# Patient Record
Sex: Female | Born: 1945 | Race: White | Hispanic: No | Marital: Married | Smoking: Former smoker
Health system: Southern US, Community
[De-identification: ages and names within clinical notes are randomized; demographics above are authoritative.]

## PROBLEM LIST (undated history)

## (undated) DIAGNOSIS — M1990 Unspecified osteoarthritis, unspecified site: Secondary | ICD-10-CM

## (undated) DIAGNOSIS — N200 Calculus of kidney: Secondary | ICD-10-CM

## (undated) DIAGNOSIS — K122 Cellulitis and abscess of mouth: Secondary | ICD-10-CM

## (undated) DIAGNOSIS — I10 Essential (primary) hypertension: Secondary | ICD-10-CM

## (undated) DIAGNOSIS — E7800 Pure hypercholesterolemia, unspecified: Secondary | ICD-10-CM

## (undated) DIAGNOSIS — I619 Nontraumatic intracerebral hemorrhage, unspecified: Secondary | ICD-10-CM

## (undated) HISTORY — PX: APPENDECTOMY: SHX54

## (undated) HISTORY — PX: DILATION AND CURETTAGE OF UTERUS: SHX78

## (undated) HISTORY — PX: BACK SURGERY: SHX140

## (undated) HISTORY — PX: CHOLECYSTECTOMY: SHX55

---

## 1979-10-23 DIAGNOSIS — I619 Nontraumatic intracerebral hemorrhage, unspecified: Secondary | ICD-10-CM

## 1979-10-23 HISTORY — DX: Nontraumatic intracerebral hemorrhage, unspecified: I61.9

## 1988-10-22 HISTORY — PX: BACK SURGERY: SHX140

## 2006-11-29 ENCOUNTER — Ambulatory Visit: Payer: Self-pay | Admitting: Unknown Physician Specialty

## 2007-10-14 ENCOUNTER — Other Ambulatory Visit: Payer: Self-pay

## 2007-10-14 ENCOUNTER — Ambulatory Visit: Payer: Self-pay | Admitting: Unknown Physician Specialty

## 2007-10-20 ENCOUNTER — Ambulatory Visit: Payer: Self-pay | Admitting: Unknown Physician Specialty

## 2014-10-22 HISTORY — PX: ROOT CANAL: SHX2363

## 2014-10-22 HISTORY — PX: OTHER SURGICAL HISTORY: SHX169

## 2015-10-04 ENCOUNTER — Encounter
Admission: RE | Admit: 2015-10-04 | Discharge: 2015-10-04 | Disposition: A | Discharge status: Home / Self Care | Payer: BLUE CROSS/BLUE SHIELD | Source: Ambulatory Visit | Attending: Specialist | Admitting: Specialist

## 2015-10-04 DIAGNOSIS — Z01812 Encounter for preprocedural laboratory examination: Secondary | ICD-10-CM | POA: Diagnosis present

## 2015-10-04 HISTORY — DX: Cellulitis and abscess of mouth: K12.2

## 2015-10-04 HISTORY — DX: Nontraumatic intracerebral hemorrhage, unspecified: I61.9

## 2015-10-04 HISTORY — DX: Unspecified osteoarthritis, unspecified site: M19.90

## 2015-10-04 HISTORY — DX: Essential (primary) hypertension: I10

## 2015-10-04 HISTORY — DX: Calculus of kidney: N20.0

## 2015-10-04 HISTORY — DX: Pure hypercholesterolemia, unspecified: E78.00

## 2015-10-04 LAB — BASIC METABOLIC PANEL
ANION GAP: 8 (ref 5–15)
BUN: 20 mg/dL (ref 6–20)
CALCIUM: 9.6 mg/dL (ref 8.9–10.3)
CO2: 28 mmol/L (ref 22–32)
CREATININE: 0.71 mg/dL (ref 0.44–1.00)
Chloride: 102 mmol/L (ref 101–111)
GFR calc non Af Amer: >60 mL/min (ref >60)
Glucose, Bld: 99 mg/dL (ref 65–99)
Potassium: 3.3 mmol/L — ABNORMAL LOW (ref 3.5–5.1)
SODIUM: 138 mmol/L (ref 135–145)

## 2015-10-04 NOTE — Pre-Procedure Instructions (Signed)
Dr. Sabra Heck called regarding pt having to stop Mobic prior to surgery on 10/10/15 and pt having chronic back pain being severe, Tylenol will not help much with chronic back pain. Dr. Sabra Heck said pt can continue with Mobic.

## 2015-10-04 NOTE — Pre-Procedure Instructions (Signed)
Today's Met B results low potassium at 3.3, faxed to Dr. Sabra Heck informing him the potassium level will be rechcked the am of surgery.

## 2015-10-04 NOTE — Patient Instructions (Addendum)
  Your procedure is scheduled on: Monday Dec. 19, 2016. Report to Same Day Surgery. To find out your arrival time please call [phone] between 1PM - 3PM on Friday Dec.16, 2016 .  Remember: Instructions that are not followed completely may result in serious medical risk, up to and including death, or upon the discretion of your surgeon and anesthesiologist your surgery may need to be rescheduled.    _x___ 1. Do not eat food or drink liquids after midnight. No gum chewing or hard candies.     ____ 2. No Alcohol for 24 hours before or after surgery.   ____ 3. Bring all medications with you on the day of surgery if instructed.    __x__ 4. Notify your doctor if there is any change in your medical condition     (cold, fever, infections).     Do not wear jewelry, make-up, hairpins, clips or nail polish.  Do not wear lotions, powders, or perfumes. You may wear deodorant.  Do not shave 48 hours prior to surgery. Men may shave face and neck.  Do not bring valuables to the hospital.    [facility] is not responsible for any belongings or valuables.               Contacts, dentures or bridgework may not be worn into surgery.  Leave your suitcase in the car. After surgery it may be brought to your room.  For patients admitted to the hospital, discharge time is determined by your treatment team.   Patients discharged the day of surgery will not be allowed to drive home.    Please read over the following fact sheets that you were given:   [facility]  _x___ Take these medicines the morning of surgery with A SIP OF WATER:    1. atorvastatin (LIPITOR)  2. omeprazole (PRILOSEC)      ____ Fleet Enema (as directed)   _x__ Use CHG Soap as directed  ____ Use inhalers on the day of surgery  ____ Stop metformin 2 days prior to surgery    ____ Take 1/2 of usual insulin dose the night before surgery and none on the morning of surgery.   _x___ Stop aspirin on  now.  _x___ Stop Anti-inflammatories Aleve, Ibuprofen, Motrin now.   _x___ Stop supplements such as vitamins, ginkgo biloba until after surgery.    ____ Bring C-Pap to the hospital.

## 2015-10-10 ENCOUNTER — Ambulatory Visit: Payer: BLUE CROSS/BLUE SHIELD | Admitting: Certified Registered"

## 2015-10-10 ENCOUNTER — Encounter: Payer: Self-pay | Admitting: *Deleted

## 2015-10-10 ENCOUNTER — Observation Stay
Admission: RE | Admit: 2015-10-10 | Discharge: 2015-10-11 | Disposition: A | Discharge status: Home / Self Care | Payer: BLUE CROSS/BLUE SHIELD | Source: Ambulatory Visit | Attending: Specialist | Admitting: Specialist

## 2015-10-10 ENCOUNTER — Encounter
Admission: RE | Disposition: A | Discharge status: Home / Self Care | Payer: Self-pay | Source: Ambulatory Visit | Attending: Specialist

## 2015-10-10 DIAGNOSIS — I10 Essential (primary) hypertension: Secondary | ICD-10-CM | POA: Diagnosis not present

## 2015-10-10 DIAGNOSIS — M1812 Unilateral primary osteoarthritis of first carpometacarpal joint, left hand: Secondary | ICD-10-CM | POA: Diagnosis not present

## 2015-10-10 DIAGNOSIS — Z79899 Other long term (current) drug therapy: Secondary | ICD-10-CM | POA: Diagnosis not present

## 2015-10-10 DIAGNOSIS — M67442 Ganglion, left hand: Secondary | ICD-10-CM | POA: Insufficient documentation

## 2015-10-10 DIAGNOSIS — G5602 Carpal tunnel syndrome, left upper limb: Principal | ICD-10-CM | POA: Insufficient documentation

## 2015-10-10 DIAGNOSIS — M67432 Ganglion, left wrist: Secondary | ICD-10-CM | POA: Diagnosis not present

## 2015-10-10 DIAGNOSIS — M659 Synovitis and tenosynovitis, unspecified: Secondary | ICD-10-CM | POA: Insufficient documentation

## 2015-10-10 DIAGNOSIS — Z791 Long term (current) use of non-steroidal anti-inflammatories (NSAID): Secondary | ICD-10-CM | POA: Diagnosis not present

## 2015-10-10 DIAGNOSIS — G8918 Other acute postprocedural pain: Secondary | ICD-10-CM | POA: Diagnosis present

## 2015-10-10 DIAGNOSIS — Z87891 Personal history of nicotine dependence: Secondary | ICD-10-CM | POA: Insufficient documentation

## 2015-10-10 DIAGNOSIS — Z7982 Long term (current) use of aspirin: Secondary | ICD-10-CM | POA: Diagnosis not present

## 2015-10-10 HISTORY — PX: FINGER ARTHROPLASTY: SHX5017

## 2015-10-10 HISTORY — PX: GANGLION CYST EXCISION: SHX1691

## 2015-10-10 HISTORY — PX: CARPAL TUNNEL RELEASE: SHX101

## 2015-10-10 LAB — POCT I-STAT 4, (NA,K, GLUC, HGB,HCT)
GLUCOSE: 108 mg/dL — AB (ref 65–99)
HEMATOCRIT: 43 % (ref 36.0–46.0)
Hemoglobin: 14.6 g/dL (ref 12.0–15.0)
POTASSIUM: 3.7 mmol/L (ref 3.5–5.1)
SODIUM: 139 mmol/L (ref 135–145)

## 2015-10-10 SURGERY — CARPAL TUNNEL RELEASE
Anesthesia: General | Laterality: Left

## 2015-10-10 MED ORDER — POTASSIUM CITRATE ER 10 MEQ (1080 MG) PO TBCR: 10.0000 meq | EXTENDED_RELEASE_TABLET | Freq: Two times a day (BID) | ORAL | Status: DC

## 2015-10-10 MED ORDER — CEFAZOLIN SODIUM 1-5 GM-% IV SOLN: 1.0000 g | Freq: Three times a day (TID) | INTRAVENOUS | Status: DC

## 2015-10-10 MED ORDER — FENTANYL CITRATE (PF) 100 MCG/2ML IJ SOLN
INTRAMUSCULAR | Status: DC | PRN
  Administered 2015-10-10 (×4): 50 ug via INTRAVENOUS

## 2015-10-10 MED ORDER — FENTANYL CITRATE (PF) 100 MCG/2ML IJ SOLN
INTRAMUSCULAR | Status: AC
  Filled 2015-10-10: qty 2

## 2015-10-10 MED ORDER — SENNOSIDES-DOCUSATE SODIUM 8.6-50 MG PO TABS
5.0000 | ORAL_TABLET | Freq: Every day | ORAL | Status: DC
  Administered 2015-10-10: 5 via ORAL
  Filled 2015-10-10: qty 5

## 2015-10-10 MED ORDER — LISINOPRIL 20 MG PO TABS
20.0000 mg | ORAL_TABLET | Freq: Every day | ORAL | Status: DC
  Administered 2015-10-10 – 2015-10-11 (×2): 20 mg via ORAL
  Filled 2015-10-10 (×2): qty 1

## 2015-10-10 MED ORDER — GABAPENTIN 400 MG PO CAPS: 400.0000 mg | ORAL_CAPSULE | Freq: Three times a day (TID) | ORAL | Status: AC

## 2015-10-10 MED ORDER — ONDANSETRON HCL 4 MG/2ML IJ SOLN
4.0000 mg | Freq: Once | INTRAMUSCULAR | Status: AC | PRN
  Administered 2015-10-10: 4 mg via INTRAVENOUS

## 2015-10-10 MED ORDER — MELOXICAM 15 MG PO TABS
15.0000 mg | ORAL_TABLET | Freq: Every day | ORAL | Status: AC
Start: 2015-10-10 — End: ?

## 2015-10-10 MED ORDER — HYDROMORPHONE HCL 1 MG/ML IJ SOLN
INTRAMUSCULAR | Status: AC
  Filled 2015-10-10: qty 1

## 2015-10-10 MED ORDER — HYDROCHLOROTHIAZIDE 25 MG PO TABS
25.0000 mg | ORAL_TABLET | Freq: Every day | ORAL | Status: DC
Start: 2015-10-10 — End: 2015-10-11
  Administered 2015-10-10 – 2015-10-11 (×2): 25 mg via ORAL
  Filled 2015-10-10 (×2): qty 1

## 2015-10-10 MED ORDER — MELOXICAM 7.5 MG PO TABS
15.0000 mg | ORAL_TABLET | Freq: Once | ORAL | Status: AC
  Administered 2015-10-10: 7.5 mg via ORAL

## 2015-10-10 MED ORDER — BISACODYL 5 MG PO TBEC: 5.0000 mg | DELAYED_RELEASE_TABLET | Freq: Every day | ORAL | Status: DC | PRN

## 2015-10-10 MED ORDER — GLYCOPYRROLATE 0.2 MG/ML IJ SOLN
INTRAMUSCULAR | Status: DC | PRN
  Administered 2015-10-10: 0.2 mg via INTRAVENOUS

## 2015-10-10 MED ORDER — MAGNESIUM CITRATE PO SOLN: 1.0000 | Freq: Once | ORAL | Status: DC | PRN

## 2015-10-10 MED ORDER — DIPHENHYDRAMINE HCL 25 MG PO CAPS: 25.0000 mg | ORAL_CAPSULE | Freq: Four times a day (QID) | ORAL | Status: DC | PRN

## 2015-10-10 MED ORDER — LISINOPRIL-HYDROCHLOROTHIAZIDE 20-25 MG PO TABS: 1.0000 | ORAL_TABLET | ORAL | Status: DC

## 2015-10-10 MED ORDER — MELOXICAM 7.5 MG PO TABS
ORAL_TABLET | ORAL | Status: AC
  Administered 2015-10-10: 7.5 mg via ORAL
  Filled 2015-10-10: qty 2

## 2015-10-10 MED ORDER — GABAPENTIN 400 MG PO CAPS
ORAL_CAPSULE | ORAL | Status: AC
  Administered 2015-10-10: 400 mg via ORAL
  Filled 2015-10-10: qty 1

## 2015-10-10 MED ORDER — SODIUM CHLORIDE 0.9 % IV SOLN
INTRAVENOUS | Status: DC
  Administered 2015-10-10: 20:00:00 via INTRAVENOUS

## 2015-10-10 MED ORDER — HYDROCODONE-ACETAMINOPHEN 7.5-325 MG PO TABS
1.0000 | ORAL_TABLET | ORAL | Status: DC | PRN
  Administered 2015-10-11: 1 via ORAL
  Filled 2015-10-10: qty 1

## 2015-10-10 MED ORDER — CEFAZOLIN SODIUM 1-5 GM-% IV SOLN
1.0000 g | Freq: Three times a day (TID) | INTRAVENOUS | Status: DC
  Administered 2015-10-10 – 2015-10-11 (×2): 1 g via INTRAVENOUS
  Filled 2015-10-10 (×4): qty 50

## 2015-10-10 MED ORDER — PHENYLEPHRINE HCL 10 MG/ML IJ SOLN
INTRAMUSCULAR | Status: DC | PRN
  Administered 2015-10-10 (×3): 100 ug via INTRAVENOUS

## 2015-10-10 MED ORDER — DEXAMETHASONE SODIUM PHOSPHATE 10 MG/ML IJ SOLN
INTRAMUSCULAR | Status: DC | PRN
  Administered 2015-10-10: 5 mg via INTRAVENOUS

## 2015-10-10 MED ORDER — POLYETHYLENE GLYCOL 3350 17 G PO PACK
17.0000 g | PACK | Freq: Every day | ORAL | Status: DC
  Administered 2015-10-10: 17 g via ORAL
  Filled 2015-10-10: qty 1

## 2015-10-10 MED ORDER — LACTATED RINGERS IV SOLN
INTRAVENOUS | Status: DC
  Administered 2015-10-10 (×2): via INTRAVENOUS

## 2015-10-10 MED ORDER — NEOMYCIN-POLYMYXIN B GU 40-200000 IR SOLN
Status: AC
  Filled 2015-10-10: qty 2

## 2015-10-10 MED ORDER — NALOXONE HCL 0.4 MG/ML IJ SOLN: 0.4000 mg | INTRAMUSCULAR | Status: DC | PRN

## 2015-10-10 MED ORDER — HYDROCODONE-ACETAMINOPHEN 5-325 MG PO TABS: 1.0000 | ORAL_TABLET | Freq: Four times a day (QID) | ORAL | Status: AC | PRN

## 2015-10-10 MED ORDER — HYDROMORPHONE HCL 1 MG/ML IJ SOLN
0.5000 mg | INTRAMUSCULAR | Status: DC | PRN
  Administered 2015-10-10: 0.5 mg via INTRAVENOUS

## 2015-10-10 MED ORDER — MAGNESIUM HYDROXIDE 400 MG/5ML PO SUSP: 30.0000 mL | Freq: Every day | ORAL | Status: DC | PRN

## 2015-10-10 MED ORDER — ONDANSETRON HCL 4 MG/2ML IJ SOLN
INTRAMUSCULAR | Status: AC
  Filled 2015-10-10: qty 2

## 2015-10-10 MED ORDER — CEFAZOLIN SODIUM-DEXTROSE 2-3 GM-% IV SOLR
INTRAVENOUS | Status: AC
  Filled 2015-10-10: qty 50

## 2015-10-10 MED ORDER — MIDAZOLAM HCL 2 MG/2ML IJ SOLN
INTRAMUSCULAR | Status: DC | PRN
  Administered 2015-10-10: 2 mg via INTRAVENOUS

## 2015-10-10 MED ORDER — PANTOPRAZOLE SODIUM 40 MG PO TBEC
40.0000 mg | DELAYED_RELEASE_TABLET | Freq: Every day | ORAL | Status: DC
  Administered 2015-10-10: 40 mg via ORAL
  Filled 2015-10-10 (×2): qty 1

## 2015-10-10 MED ORDER — PROPOFOL 10 MG/ML IV BOLUS
INTRAVENOUS | Status: DC | PRN
  Administered 2015-10-10: 150 mg via INTRAVENOUS

## 2015-10-10 MED ORDER — GABAPENTIN 300 MG PO CAPS: 300.0000 mg | ORAL_CAPSULE | Freq: Two times a day (BID) | ORAL | Status: DC

## 2015-10-10 MED ORDER — ASPIRIN EC 81 MG PO TBEC
81.0000 mg | DELAYED_RELEASE_TABLET | ORAL | Status: DC
  Administered 2015-10-11: 81 mg via ORAL
  Filled 2015-10-10: qty 1

## 2015-10-10 MED ORDER — CEFAZOLIN SODIUM-DEXTROSE 2-3 GM-% IV SOLR
2.0000 g | Freq: Once | INTRAVENOUS | Status: AC
  Administered 2015-10-10: 2 g via INTRAVENOUS

## 2015-10-10 MED ORDER — GINKGO BILOBA 40 MG PO TABS: 1.0000 | ORAL_TABLET | ORAL | Status: DC

## 2015-10-10 MED ORDER — VITAMIN C 500 MG PO TABS
500.0000 mg | ORAL_TABLET | ORAL | Status: DC
  Administered 2015-10-11: 500 mg via ORAL
  Filled 2015-10-10: qty 1

## 2015-10-10 MED ORDER — ATORVASTATIN CALCIUM 20 MG PO TABS
20.0000 mg | ORAL_TABLET | ORAL | Status: DC
  Administered 2015-10-11: 20 mg via ORAL
  Filled 2015-10-10: qty 1

## 2015-10-10 MED ORDER — BUPIVACAINE HCL (PF) 0.5 % IJ SOLN
INTRAMUSCULAR | Status: AC
  Filled 2015-10-10: qty 60

## 2015-10-10 MED ORDER — ADULT MULTIVITAMIN W/MINERALS CH
1.0000 | ORAL_TABLET | ORAL | Status: DC
  Filled 2015-10-10: qty 1

## 2015-10-10 MED ORDER — GABAPENTIN 400 MG PO CAPS
400.0000 mg | ORAL_CAPSULE | Freq: Once | ORAL | Status: AC
  Administered 2015-10-10: 400 mg via ORAL

## 2015-10-10 MED ORDER — LIDOCAINE HCL (CARDIAC) 20 MG/ML IV SOLN
INTRAVENOUS | Status: DC | PRN
  Administered 2015-10-10: 50 mg via INTRAVENOUS

## 2015-10-10 MED ORDER — FENTANYL CITRATE (PF) 100 MCG/2ML IJ SOLN
25.0000 ug | INTRAMUSCULAR | Status: AC | PRN
  Administered 2015-10-10 (×6): 25 ug via INTRAVENOUS

## 2015-10-10 MED ORDER — VITAMIN D 1000 UNITS PO TABS
1000.0000 [IU] | ORAL_TABLET | ORAL | Status: DC
  Administered 2015-10-11: 1000 [IU] via ORAL
  Filled 2015-10-10: qty 1

## 2015-10-10 MED ORDER — MELOXICAM 7.5 MG PO TABS
7.5000 mg | ORAL_TABLET | Freq: Two times a day (BID) | ORAL | Status: DC
  Administered 2015-10-10 – 2015-10-11 (×2): 7.5 mg via ORAL
  Filled 2015-10-10 (×2): qty 1

## 2015-10-10 MED ORDER — NALOXONE HCL 2 MG/2ML IJ SOSY: 0.4000 mg | PREFILLED_SYRINGE | INTRAMUSCULAR | Status: DC | PRN

## 2015-10-10 SURGICAL SUPPLY — 49 items
BLADE OSC/SAGITTAL MD 5.5X18 (BLADE) IMPLANT
BLADE SURG MINI STRL (BLADE) ×3 IMPLANT
BNDG ESMARK 4X12 TAN STRL LF (GAUZE/BANDAGES/DRESSINGS) IMPLANT
CANISTER SUCT 1200ML W/VALVE (MISCELLANEOUS) ×3 IMPLANT
CHLORAPREP W/TINT 26ML (MISCELLANEOUS) ×3 IMPLANT
DECANTER SPIKE VIAL GLASS SM (MISCELLANEOUS) ×3 IMPLANT
DRAPE FLUOR MINI C-ARM 54X84 (DRAPES) ×3 IMPLANT
GAUZE FLUFF 18X24 1PLY STRL (GAUZE/BANDAGES/DRESSINGS) ×9 IMPLANT
GAUZE PETRO XEROFOAM 1X8 (MISCELLANEOUS) ×6 IMPLANT
GAUZE SPONGE 4X4 12PLY STRL (GAUZE/BANDAGES/DRESSINGS) ×3 IMPLANT
GLOVE BIO SURGEON STRL SZ7.5 (GLOVE) ×3 IMPLANT
GLOVE BIO SURGEON STRL SZ8 (GLOVE) ×3 IMPLANT
GLOVE SURG ORTHO 8.0 STRL STRW (GLOVE) ×3 IMPLANT
GOWN STRL REUS W/ TWL LRG LVL3 (GOWN DISPOSABLE) ×2 IMPLANT
GOWN STRL REUS W/TWL LRG LVL3 (GOWN DISPOSABLE) ×4
KIT RM TURNOVER STRD PROC AR (KITS) ×3 IMPLANT
LOOP VESSEL MINI 0.8X406 BLUE (MISCELLANEOUS) ×1 IMPLANT
LOOPS BLUE MINI 0.8X406MM (MISCELLANEOUS) ×2
NEEDLE FILTER BLUNT 18X 1/2SAF (NEEDLE) ×2
NEEDLE FILTER BLUNT 18X1 1/2 (NEEDLE) ×1 IMPLANT
NS IRRIG 500ML POUR BTL (IV SOLUTION) ×3 IMPLANT
PACK EXTREMITY ARMC (MISCELLANEOUS) ×3 IMPLANT
PAD CAST CTTN 4X4 STRL (SOFTGOODS) ×1 IMPLANT
PAD GROUND ADULT SPLIT (MISCELLANEOUS) ×3 IMPLANT
PAD PREP 24X41 OB/GYN DISP (PERSONAL CARE ITEMS) ×3 IMPLANT
PADDING CAST COTTON 4X4 STRL (SOFTGOODS) ×2
PASSER SUT SWANSON 36MM LOOP (INSTRUMENTS) ×3 IMPLANT
SPLINT CAST 1 STEP 3X12 (MISCELLANEOUS) ×3 IMPLANT
SPONGE LAP 18X18 5 PK (GAUZE/BANDAGES/DRESSINGS) ×3 IMPLANT
STOCKINETTE BIAS CUT 4 980044 (GAUZE/BANDAGES/DRESSINGS) IMPLANT
STOCKINETTE STRL 4IN 9604848 (GAUZE/BANDAGES/DRESSINGS) IMPLANT
STRAP SAFETY BODY (MISCELLANEOUS) ×3 IMPLANT
SUT ETHILON 4-0 (SUTURE) ×4
SUT ETHILON 4-0 FS2 18XMFL BLK (SUTURE) ×2
SUT ETHILON 5-0 (SUTURE) ×2
SUT ETHILON 5-0 C-3 18XMFL BLK (SUTURE) ×1
SUT ETHILON 5-0 FS-2 18 BLK (SUTURE) ×3 IMPLANT
SUT VIC AB 2-0 SH 27 (SUTURE) ×2
SUT VIC AB 2-0 SH 27XBRD (SUTURE) ×1 IMPLANT
SUT VIC AB 3-0 SH 27 (SUTURE) ×2
SUT VIC AB 3-0 SH 27X BRD (SUTURE) ×1 IMPLANT
SUT VIC AB 4-0 FS2 27 (SUTURE) ×3 IMPLANT
SUT VIC AB 4-0 SH 27 (SUTURE) ×2
SUT VIC AB 4-0 SH 27XANBCTRL (SUTURE) ×1 IMPLANT
SUTURE ETHLN 4-0 FS2 18XMF BLK (SUTURE) ×2 IMPLANT
SUTURE ETHLN 5-0 C3 18XMF BLK (SUTURE) ×1 IMPLANT
SYR 30ML LL (SYRINGE) ×3 IMPLANT
SYR 5ML LL (SYRINGE) ×3 IMPLANT
WIRE Z .062 C-WIRE SPADE TIP (WIRE) IMPLANT

## 2015-10-10 NOTE — Progress Notes (Signed)
  Subjective:  Called by Same Day Surgery Unit RN.  Patient has had severe pain post-op requiring doses of fentanyl and dilaudid.  Now patient's pain better controlled but patient drowsy.  Anesthesia, Dr. Laureen Abrahams has recommended to RN that patient be admitted for monitoring.  RN reports that patient's fingers on the operative left hand are well perfused and she is neurovascularly intact.  Patient had left carpal tunnel release, CMC arthroplasty, and ganglion removal.  Objective:   VITALS:   Filed Vitals:   10/10/15 1639 10/10/15 1736 10/10/15 1829 10/10/15 1921  BP: 125/67 123/70 129/73   Pulse: 92 87 82 74  Temp: 97.7 F (36.5 C) 97.8 F (36.6 C) 97.4 F (36.3 C) 97.2 F (36.2 C)  TempSrc: Oral Tympanic Tympanic Tympanic  Resp: 14 14 14 14   Height:      Weight:      SpO2: 92% 92% 99% 96%    LABS  Results for orders placed or performed during the hospital encounter of 10/10/15 (from the past 24 hour(s))  I-STAT 4, (NA,K, GLUC, HGB,HCT)     Status: Abnormal   Collection Time: 10/10/15 12:47 PM  Result Value Ref Range   Sodium 139 135 - 145 mmol/L   Potassium 3.7 3.5 - 5.1 mmol/L   Glucose, Bld 108 (H) 65 - 99 mg/dL   HCT 43.0 36.0 - 46.0 %   Hemoglobin 14.6 12.0 - 15.0 g/dL    No results found.  Assessment/Plan: Day of Surgery   Active Problems:   Post-op pain  Patient being admitted to orthopaedics for post-op monitoring.  Patient will require close O2 and neurovascular monitoring.  Continue strict elevation of left upper extremity.  Continue post-op IV kefzol overnight.  Pain management with norco and dilaudid for severe pain.   Thornton Park , MD 10/10/2015, 7:28 PM

## 2015-10-10 NOTE — Progress Notes (Signed)
PHARMACIST - PHYSICIAN ORDER COMMUNICATION  CONCERNING: P&T Medication Policy on Herbal Medications  DESCRIPTION:  This patient's order for:  Ginko biloba  has been noted.  This product(s) is classified as an "herbal" or natural product. Due to a lack of definitive safety studies or FDA approval, nonstandard manufacturing practices, plus the potential risk of unknown drug-drug interactions while on inpatient medications, the Pharmacy and Therapeutics Committee does not permit the use of "herbal" or natural products of this type within [facility].   ACTION TAKEN: The pharmacy department is unable to verify this order at this time and your patient has been informed of this safety policy. Please reevaluate patient's clinical condition at discharge and address if the herbal or natural product(s) should be resumed at that time.

## 2015-10-10 NOTE — OR Nursing (Signed)
Patient to rm 142-- report to [facility]--

## 2015-10-10 NOTE — Discharge Instructions (Signed)
AMBULATORY SURGERY  DISCHARGE INSTRUCTIONS   1) The drugs that you were given will stay in your system until tomorrow so for the next 24 hours you should not:  A) Drive an automobile B) Make any legal decisions C) Drink any alcoholic beverage   2) You may resume regular meals tomorrow.  Today it is better to start with liquids and gradually work up to solid foods.  You may eat anything you prefer, but it is better to start with liquids, then soup and crackers, and gradually work up to solid foods.   3) Please notify your doctor immediately if you have any unusual bleeding, trouble breathing, redness and pain at the surgery site, drainage, fever, or pain not relieved by medication.    4) Additional Instructions:        Please contact your physician with any problems or Same Day Surgery at [phone], Monday through Friday 6 am to 4 pm, or [place] at [facility] number at [phone].                                                                   Keep dressing clean and dry--Do not remove dressing--Keep arm elevated above heart

## 2015-10-10 NOTE — OR Nursing (Signed)
Patient pulse ox continues to drop into low 80,s when patient sleeping--pt nauseated and given Zofran--continues to be nauseated--Anesthesia inst to call surgeon if she didn't improve in an hour--After 1 hour called Dr Rolm Gala as he was on call--He is going to adm patient for obs.

## 2015-10-10 NOTE — Anesthesia Procedure Notes (Signed)
Procedure Name: LMA Insertion Date/Time: 10/10/2015 1:19 PM Performed by: Rolla Plate Pre-anesthesia Checklist: Patient identified, Patient being monitored, Timeout performed, Emergency Drugs available and Suction available Patient Re-evaluated:Patient Re-evaluated prior to inductionOxygen Delivery Method: Circle system utilized Preoxygenation: Pre-oxygenation with 100% oxygen Intubation Type: IV induction Ventilation: Mask ventilation without difficulty LMA: LMA inserted LMA Size: 3.0 Tube type: Oral Number of attempts: 1 Placement Confirmation: positive ETCO2 and breath sounds checked- equal and bilateral Tube secured with: Tape Dental Injury: Teeth and Oropharynx as per pre-operative assessment

## 2015-10-10 NOTE — Transfer of Care (Signed)
Immediate Anesthesia Transfer of Care Note  Patient: Savannah Abbott  Procedure(s) Performed: Procedure(s): CARPAL TUNNEL RELEASE (Left)  ARTHROPLASTY/LEFT Thumb CMC (Left) REMOVAL GANGLION from wrist and between ring and little fingeer (Left)  Patient Location: PACU  Anesthesia Type:General  Level of Consciousness: sedated  Airway & Oxygen Therapy: Patient Spontanous Breathing and Patient connected to face mask oxygen  Post-op Assessment: Report given to RN and Post -op Vital signs reviewed and stable  Post vital signs: Reviewed and stable  Last Vitals:  Filed Vitals:   10/10/15 1215 10/10/15 1528  BP: 134/76 116/88  Pulse: 77 82  Temp: 36.9 C 36.2 C  Resp: 14 16    Complications: No apparent anesthesia complications

## 2015-10-10 NOTE — Op Note (Signed)
10/10/2015  3:25 PM  PATIENT:  Savannah Abbott    PRE-OPERATIVE DIAGNOSIS:  LEFT CARPAL TUNNEL SYNDROME,OSTEOARTHRITIS THUMB Wanda LEFT WRIST AND HAND  POST-OPERATIVE DIAGNOSIS:  Same  PROCEDURE:  LEFT CARPAL TUNNEL RELEASE,  ARTHROPLASTY/LEFT Thumb CMC WITH PALMARIS TENDON, REMOVAL GANGLION from wrist and  d little finger flexor tendon sheath  SURGEON:  Park Breed, MD  ANESTHESIA:   General  PREOPERATIVE INDICATIONS:  Savannah Abbott is a  69 y.o. female with a diagnosis of LEFT CARPAL TUNNEL SYNDROME,OSTEOARTHRITIS,GANGLION LEFT WRIST AND HAND who failed conservative measures and elected for surgical management.    The risks benefits and alternatives were discussed with the patient preoperatively including but not limited to the risks of infection, bleeding, nerve injury, cardiopulmonary complications, the need for revision surgery, among others, and the patient was willing to proceed.  EBL: None  TOURNIQUET TIME: 92 minutes  OPERATIVE IMPLANTS: None  OPERATIVE FINDINGS: Advanced arthritis left  thumb cmc joint.  Marked compression left median nerve.  Ganglion of tendon sheath left small finger.  Synovitis, left wrist dorsally with small ganglion  OPERATIVE PROCEDURE: The patient was brought to the operating room and underwent satisfactory IV regional anesthesia in the supine position. The operative arm was prepped and draped in a sterile fashion. The patient was noted to have an excellent palmaris longus tendon. 3 short transverse incisions were made over the course of the tendon and it was harvested under direct vision. It was then placed in a moist sponge on the back table. An S-shaped incision was then made over the base of the thumb metacarpal dorsally. Dissection was carried out carefully under loupe magnification, carefully sparing the sensory nerves. The tendon sheath around the abductor pollicis longus and extensor pollicis brevis was released under  direct vision, including over the radial styloid. Nerves were carefully spared. Retractors were inserted and the capsule of the trapezium and metacarpal were opened longitudinally. The radial artery and veins were carefully dissected free and retracted with a vessel loop drain. The capsule was dissected off the trapezium and the trapezium was then morselized with the oscillating saw and rongeur. The flexor carpi radialis tendon was freed up in the base of the wound. The palmaris longus tendon was passed beneath this. A 3.5 mm drill was used to create a tunnel through the base of the metacarpal. The tendon was then passed up through the metacarpal using a tendon passer. The thumb had been placed in traction which was then released. The tendon was tied upon itself and the knot was sutured to itself prevent slippage. It was then tied in multiple knots and sutured into a ball. This was then rotated into the space between the metacarpal and the scaphoid. The capsule was then carefully and thoroughly, closed with 4-0 Ethibond suture. After irrigation, the skin was closed with 4-0 nylon. The forearm wounds had been closed with a similar suture.  A transverse incision was made over the one pulley of the left small finger.  Dissection was carried out bluntly through the tendon sheath.  A ganglion was seen to have pushed up through the sheath.  The A1 pulley was excised, exposing the cyst and quite a bit of synovitis which was excised.  The wound was irrigated and closed with 5-0 nylon suture.  Next, the left carpal tunnel incision was made at the base of the palm, just the ulnar side of midline.  Dissection was carried out bluntly the subcutaneous tissue.  The distal aspect of  the volar carpal ligament was identified and a Kelly clamp passed beneath it.  Soft tissue was divided off of the volar aspect of the ligament.  A short transverse incision was also made.  The insertion of the palmaris longus  tendon.  This was exposed and a 3-0 Vicryl suture placed in the tendon, which was then released.  Under direct vision, the volar carpal ligament was divided with a mini blade knife.  The median nerve was exposed in this fashion and was seen to have an hourglass deformity.  The motor branch was identified and was intact.  The nerve was freed up from adhesions using a small hemostat.  Once the nerve was completely freed.  The wound was irrigated and closed with 4-0 nylon suture.  Finally, the dorsum of the wrist was approached dorsally and a transverse incision was made over the radial carpal region.  Dissection was carried out bluntly through subcutaneous tissue.  Tendon sheaths were full with some synovitis.  Tendons were retracted medially and laterally and the capsule was seen to be full as well.  This was incised longitudinally.  Synovitis was removed and sent for pathology.  The wound was irrigated.  The capsule closed with 3-0 nylon 100.  3-0 Vicryl.  The skin was closed with 4 nylon.  Half percent Marcaine was then placed in all wounds.  Dry sterile dressings were applied.   A well-padded thumb spica splint was applied. Tourniquet was deflated with good return of blood flow to the hand. Sponge and needle counts were correct. Patient was taken to recovery in good condition.  Park Breed, MD

## 2015-10-10 NOTE — Anesthesia Preprocedure Evaluation (Signed)
Anesthesia Evaluation  Patient identified by MRN, date of birth, ID band Patient awake    Reviewed: Allergy & Precautions, NPO status , Patient's Chart, lab work & pertinent test results, reviewed documented beta blocker date and time   Airway Mallampati: II  TM Distance: >3 FB     Dental  (+) Chipped   Pulmonary former smoker,           Cardiovascular hypertension, Pt. on medications      Neuro/Psych    GI/Hepatic   Endo/Other    Renal/GU Renal InsufficiencyRenal disease     Musculoskeletal  (+) Arthritis ,   Abdominal   Peds  Hematology   Anesthesia Other Findings   Reproductive/Obstetrics                             Anesthesia Physical Anesthesia Plan  ASA: II  Anesthesia Plan: General   Post-op Pain Management:    Induction: Intravenous  Airway Management Planned: LMA  Additional Equipment:   Intra-op Plan:   Post-operative Plan:   Informed Consent: I have reviewed the patients History and Physical, chart, labs and discussed the procedure including the risks, benefits and alternatives for the proposed anesthesia with the patient or authorized representative who has indicated his/her understanding and acceptance.     Plan Discussed with: CRNA  Anesthesia Plan Comments:         Anesthesia Quick Evaluation

## 2015-10-10 NOTE — OR Nursing (Signed)
Patient awakened, crying and restless c/o pain in hand.  Local given, patient started on pain medications immediately with little relief

## 2015-10-10 NOTE — H&P (Signed)
THE PATIENT WAS SEEN IN THE HOLDING AREA.  HISTORY, ALLERGIES, HOME MEDICATIONS AND OPERATIVE PROCEDURE WERE REVIEWED. RISKS AND BENEFITS OF SURGERY DISCUSSED WITH PATIENT AGAIN.  NO CHANGES FROM INITIAL HISTORY AND PHYSICAL NOTED.    

## 2015-10-10 NOTE — Anesthesia Postprocedure Evaluation (Signed)
Anesthesia Post Note  Patient: Savannah Abbott  Procedure(s) Performed: Procedure(s) (LRB): CARPAL TUNNEL RELEASE (Left)  ARTHROPLASTY/LEFT Thumb CMC (Left) REMOVAL GANGLION from wrist and between ring and little fingeer (Left)  Patient location during evaluation: Other Anesthesia Type: General Level of consciousness: awake Pain management: pain level controlled Vital Signs Assessment: post-procedure vital signs reviewed and stable Respiratory status: spontaneous breathing Cardiovascular status: blood pressure returned to baseline    Last Vitals:  Filed Vitals:   10/10/15 1921 10/10/15 1950  BP:  140/73  Pulse: 74 70  Temp: 36.2 C 36.3 C  Resp: 14 19    Last Pain:  Filed Vitals:   10/10/15 1950  PainSc: [place]

## 2015-10-11 ENCOUNTER — Encounter: Payer: Self-pay | Admitting: Specialist

## 2015-10-11 DIAGNOSIS — G5602 Carpal tunnel syndrome, left upper limb: Secondary | ICD-10-CM | POA: Diagnosis not present

## 2015-10-11 NOTE — Progress Notes (Signed)
Pt discharging to home. Reviewed D/C instructions, pt has meds from pharmacy per husband. Follow up sched. For tomorrow w/ Dr. Sabra Heck

## 2015-10-11 NOTE — Progress Notes (Signed)
Subjective: 1 Day Post-Op Procedure(s) (LRB): CARPAL TUNNEL RELEASE (Left)  ARTHROPLASTY/LEFT Thumb CMC (Left) REMOVAL GANGLION from wrist and between ring and little fingeer (Left) Patient reports pain as moderate.  She is alert and appears comfortable.  CSM good distally with good sensation and is moving the fingers well.  Very little swelling.    Objective: Vital signs in last 24 hours: Temp:  [97.1 F (36.2 C)-98.5 F (36.9 C)] 98.4 F (36.9 C) (12/20 0819) Pulse Rate:  [70-96] 96 (12/20 0819) Resp:  [12-19] 18 (12/20 0819) BP: (116-144)/(67-103) 134/70 mmHg (12/20 0819) SpO2:  [92 %-100 %] 97 % (12/20 0819) Weight:  [71.668 kg (158 lb)-72.984 kg (160 lb 14.4 oz)] 72.984 kg (160 lb 14.4 oz) (12/19 1950)  Intake/Output from previous day: 12/19 0701 - 12/20 0700 In: 1335 [I.V.:1285; IV Piggyback:50] Out: 10 [Blood:10] Intake/Output this shift:     Recent Labs  10/10/15 1247  HGB 14.6    Recent Labs  10/10/15 1247  HCT 43.0    Recent Labs  10/10/15 1247  NA 139  K 3.7  GLUCOSE 108*   No results for input(s): LABPT, INR in the last 72 hours.  Neurologically intact Sensation intact distally Dressing dry.    Assessment/Plan: 1 Day Post-Op Procedure(s) (LRB): CARPAL TUNNEL RELEASE (Left)  ARTHROPLASTY/LEFT Thumb CMC (Left) REMOVAL GANGLION from wrist and between ring and little fingeer (Left)   D/C home. RTC tomorrow.   Robie Mcniel E 10/11/2015, 9:14 AM

## 2015-10-12 LAB — SURGICAL PATHOLOGY

## 2015-10-12 NOTE — Discharge Summary (Signed)
Physician Discharge Summary  Patient ID: Savannah Abbott MRN: [id number] DOB/AGE: 1946-09-08 69 y.o.  Admit date: 10/10/2015 Discharge date: 10/12/2015  Admission Diagnoses: Post op pain following hand surgery  Discharge Diagnoses:  Active Problems:   Post-op pain   Discharged Condition: good  Hospital Course: Patient received a large amount of narcotics in recovery and anesthesia felt that she should be monitored overnight.  She did well and was more comfortable in the mofrnign.  Neurovascular status was good and there was minimal swelling.  Dressing was dry.    Consults: None  Significant Diagnostic Studies:    Treatments: analgesia:    Discharge Exam: Blood pressure 134/70, pulse 96, temperature 98.4 F (36.9 C), temperature source Oral, resp. rate 18, height 5' (1.524 m), weight 72.984 kg (160 lb 14.4 oz), SpO2 97 %. Extremities: as above.  Good movement and neurovascular status  Disposition: 01-Home or Self Care  Discharge Instructions    Call MD for:  persistant nausea and vomiting    Complete by:  As directed      Call MD for:  redness, tenderness, or signs of infection (pain, swelling, redness, odor or green/yellow discharge around incision site)    Complete by:  As directed      Call MD for:  severe uncontrolled pain    Complete by:  As directed      Call MD for:  temperature >100.4    Complete by:  As directed      Diet - low sodium heart healthy    Complete by:  As directed      Diet - low sodium heart healthy    Complete by:  As directed      Discharge instructions    Complete by:  As directed   Ice and elevate left hand at all times Move fingers as much as possible. Return to clinic 2 days     Increase activity slowly    Complete by:  As directed      Increase activity slowly    Complete by:  As directed      No wound care    Complete by:  As directed             Medication List    TAKE these medications        aspirin 81 MG tablet  Take 81  mg by mouth every morning.     atorvastatin 20 MG tablet  Commonly known as:  LIPITOR  Take 20 mg by mouth every morning.     b complex vitamins tablet  Take 1 tablet by mouth every morning.     cholecalciferol 1000 UNITS tablet  Commonly known as:  VITAMIN D  Take 1,000 Units by mouth every morning.     gabapentin 400 MG capsule  Commonly known as:  NEURONTIN  Take 1 capsule (400 mg total) by mouth 3 (three) times daily.     Ginkgo Biloba 40 MG Tabs  Take 1 tablet by mouth every morning.     HYDROcodone-acetaminophen 5-325 MG tablet  Commonly known as:  NORCO  Take 1-2 tablets by mouth every 6 (six) hours as needed.     lisinopril-hydrochlorothiazide 20-25 MG tablet  Commonly known as:  PRINZIDE,ZESTORETIC  Take 1 tablet by mouth every morning.     meloxicam 7.5 MG tablet  Commonly known as:  MOBIC  Take 7.5 mg by mouth 2 (two) times daily.     meloxicam 15 MG tablet  Commonly known as:  MOBIC  Take 1 tablet (15 mg total) by mouth daily.     omeprazole 20 MG capsule  Commonly known as:  PRILOSEC  Take 20 mg by mouth every morning.     polyethylene glycol packet  Commonly known as:  MIRALAX / GLYCOLAX  Take 17 g by mouth at bedtime.     potassium citrate 10 MEQ (1080 MG) SR tablet  Commonly known as:  UROCIT-K  Take 10 mEq by mouth 2 (two) times daily. 2 tablets twice a day.     senna-docusate 8.6-50 MG tablet  Commonly known as:  Senokot-S  Take 5 tablets by mouth at bedtime.     vitamin C 500 MG tablet  Commonly known as:  ASCORBIC ACID  Take 500 mg by mouth every morning.     ZINC PO  Take 1 tablet by mouth every morning.           Follow-up Information    Follow up with Seibert Keeter E, MD. Schedule an appointment as soon as possible for a visit in 2 days.   Specialty:  Specialist   Why:  For wound re-check   Contact information:   [place] [address] [phone]       Please follow up.   Why:  10/12/15 at 4:00 pm       Signed: Park Breed 10/12/2015, 7:32 AM

## 2017-01-01 ENCOUNTER — Emergency Department: Payer: Worker's Compensation

## 2017-01-01 ENCOUNTER — Encounter: Payer: Self-pay | Admitting: Emergency Medicine

## 2017-01-01 ENCOUNTER — Emergency Department
Admission: EM | Admit: 2017-01-01 | Discharge: 2017-01-01 | Disposition: A | Discharge status: Home / Self Care | Payer: Worker's Compensation | Attending: Emergency Medicine | Admitting: Emergency Medicine

## 2017-01-01 DIAGNOSIS — Y939 Activity, unspecified: Secondary | ICD-10-CM | POA: Insufficient documentation

## 2017-01-01 DIAGNOSIS — R0781 Pleurodynia: Secondary | ICD-10-CM | POA: Diagnosis not present

## 2017-01-01 DIAGNOSIS — Z79899 Other long term (current) drug therapy: Secondary | ICD-10-CM | POA: Diagnosis not present

## 2017-01-01 DIAGNOSIS — S0083XA Contusion of other part of head, initial encounter: Secondary | ICD-10-CM | POA: Insufficient documentation

## 2017-01-01 DIAGNOSIS — M25561 Pain in right knee: Secondary | ICD-10-CM | POA: Insufficient documentation

## 2017-01-01 DIAGNOSIS — S62346A Nondisplaced fracture of base of fifth metacarpal bone, right hand, initial encounter for closed fracture: Secondary | ICD-10-CM | POA: Insufficient documentation

## 2017-01-01 DIAGNOSIS — Y999 Unspecified external cause status: Secondary | ICD-10-CM | POA: Insufficient documentation

## 2017-01-01 DIAGNOSIS — R51 Headache: Secondary | ICD-10-CM | POA: Insufficient documentation

## 2017-01-01 DIAGNOSIS — Z87891 Personal history of nicotine dependence: Secondary | ICD-10-CM | POA: Diagnosis not present

## 2017-01-01 DIAGNOSIS — Z7982 Long term (current) use of aspirin: Secondary | ICD-10-CM | POA: Diagnosis not present

## 2017-01-01 DIAGNOSIS — I10 Essential (primary) hypertension: Secondary | ICD-10-CM | POA: Diagnosis not present

## 2017-01-01 DIAGNOSIS — Y929 Unspecified place or not applicable: Secondary | ICD-10-CM | POA: Diagnosis not present

## 2017-01-01 DIAGNOSIS — W19XXXA Unspecified fall, initial encounter: Secondary | ICD-10-CM

## 2017-01-01 DIAGNOSIS — W01198A Fall on same level from slipping, tripping and stumbling with subsequent striking against other object, initial encounter: Secondary | ICD-10-CM | POA: Diagnosis not present

## 2017-01-01 DIAGNOSIS — S6991XA Unspecified injury of right wrist, hand and finger(s), initial encounter: Secondary | ICD-10-CM | POA: Diagnosis present

## 2017-01-01 MED ORDER — TRAMADOL HCL 50 MG PO TABS: 50.0000 mg | ORAL_TABLET | Freq: Four times a day (QID) | ORAL | 0 refills | Status: AC | PRN

## 2017-01-01 MED ORDER — NAPROXEN 500 MG PO TABS
500.0000 mg | ORAL_TABLET | Freq: Once | ORAL | Status: AC
  Administered 2017-01-01: 500 mg via ORAL
  Filled 2017-01-01: qty 1

## 2017-01-01 NOTE — ED Provider Notes (Signed)
[facility] Emergency Department Provider Note  ____________________________________________  Time seen: Approximately 4:27 PM  I have reviewed the triage vital signs and the nursing notes.   HISTORY  Chief Complaint Fall    HPI Savannah Abbott is a 71 y.o. female with a history of cerebral brain hemorrhage presents to the emergency department after tripping and falling against concrete at 2:00 PM this afternoon. Patient states that her right cheek made contact with the concrete. Patient denies loss of consciousness. She denies changes in vision, nausea, abdominal pain, disorientation, chest pain, chest tightness or shortness of breath. She has noticed some mild erythema and tenderness over the right cheek and inferior right orbit. Patient also complains of right anterior rib pain, 9 through 10th, right wrist and hand pain and mild right knee pain. No alleviating measures have been attempted. Patient denies chest pain, chest tightness, shortness of breath, abdominal pain, nausea and vomiting.    Past Medical History:  Diagnosis Date  . Arthritis   . Cerebral brain hemorrhage ([place]) 1981   fell down stairs  . Hypercholesteremia   . Hypertension   . Kidney stones   . Oral abscess     Patient Active Problem List   Diagnosis Date Noted  . Post-op pain 10/10/2015    Past Surgical History:  Procedure Laterality Date  . APPENDECTOMY    . BACK SURGERY    . BACK SURGERY  1990  . CARPAL TUNNEL RELEASE Left 10/10/2015   Procedure: CARPAL TUNNEL RELEASE;  Surgeon: Earnestine Leys, MD;  Location: [facility] ORS;  Service: Orthopedics;  Laterality: Left;  . CHOLECYSTECTOMY    . DILATION AND CURETTAGE OF UTERUS    . FINGER ARTHROPLASTY Left 10/10/2015   Procedure:  ARTHROPLASTY/LEFT Thumb CMC;  Surgeon: Earnestine Leys, MD;  Location: [facility] ORS;  Service: Orthopedics;  Laterality: Left;  . GANGLION CYST EXCISION Left 10/10/2015   Procedure: REMOVAL GANGLION from wrist and  between ring and little fingeer;  Surgeon: Earnestine Leys, MD;  Location: [facility] ORS;  Service: Orthopedics;  Laterality: Left;  . kideny stone Left 2016  . ROOT CANAL  2016    Prior to Admission medications   Medication Sig Start Date End Date Taking? Authorizing Provider  aspirin 81 MG tablet Take 81 mg by mouth every morning.    Historical Provider, MD  atorvastatin (LIPITOR) 20 MG tablet Take 20 mg by mouth every morning.    Historical Provider, MD  b complex vitamins tablet Take 1 tablet by mouth every morning.    Historical Provider, MD  cholecalciferol (VITAMIN D) 1000 UNITS tablet Take 1,000 Units by mouth every morning.    Historical Provider, MD  gabapentin (NEURONTIN) 400 MG capsule Take 1 capsule (400 mg total) by mouth 3 (three) times daily. 10/10/15   Earnestine Leys, MD  Ginkgo Biloba 40 MG TABS Take 1 tablet by mouth every morning.    Historical Provider, MD  HYDROcodone-acetaminophen (NORCO) 5-325 MG tablet Take 1-2 tablets by mouth every 6 (six) hours as needed. 10/10/15   Earnestine Leys, MD  lisinopril-hydrochlorothiazide (PRINZIDE,ZESTORETIC) 20-25 MG tablet Take 1 tablet by mouth every morning.    Historical Provider, MD  meloxicam (MOBIC) 15 MG tablet Take 1 tablet (15 mg total) by mouth daily. 10/10/15   Earnestine Leys, MD  meloxicam (MOBIC) 7.5 MG tablet Take 7.5 mg by mouth 2 (two) times daily.    Historical Provider, MD  Multiple Vitamins-Minerals (ZINC PO) Take 1 tablet by mouth every morning.  Historical Provider, MD  omeprazole (PRILOSEC) 20 MG capsule Take 20 mg by mouth every morning.    Historical Provider, MD  polyethylene glycol (MIRALAX / GLYCOLAX) packet Take 17 g by mouth at bedtime.    Historical Provider, MD  potassium citrate (UROCIT-K) 10 MEQ (1080 MG) SR tablet Take 10 mEq by mouth 2 (two) times daily. 2 tablets twice a day.    Historical Provider, MD  senna-docusate (SENOKOT-S) 8.6-50 MG tablet Take 5 tablets by mouth at bedtime.    Historical Provider, MD   traMADol (ULTRAM) 50 MG tablet Take 1 tablet (50 mg total) by mouth every 6 (six) hours as needed. 01/01/17 01/04/17  Lannie Fields, PA-C  vitamin C (ASCORBIC ACID) 500 MG tablet Take 500 mg by mouth every morning.    Historical Provider, MD    Allergies Inderal [propranolol]; Sulfa antibiotics; Codeine; and Percocet [oxycodone-acetaminophen]  No family history on file.  Social History Social History  Substance Use Topics  . Smoking status: Former Smoker    Quit date: 10/03/1988  . Smokeless tobacco: Not on file  . Alcohol use No    Review of Systems  Constitutional: Patient has right sided facial soreness Eyes: No visual changes. No discharge ENT: No upper respiratory complaints. Cardiovascular: no chest pain. Respiratory: no cough. No SOB. Gastrointestinal: No abdominal pain. No nausea, no vomiting.  No diarrhea.  No constipation. Musculoskeletal: Patient has right knee, right hand, right wrist and right anterior rib soreness.  Skin: Negative for rash, abrasions, lacerations, ecchymosis. Neurological: Negative for headaches, focal weakness or numbness. ____________________________________________   PHYSICAL EXAM:  VITAL SIGNS: ED Triage Vitals  Enc Vitals Group     BP 01/01/17 1512 (!) 174/98     Pulse Rate 01/01/17 1512 87     Resp 01/01/17 1512 17     Temp 01/01/17 1512 98 F (36.7 C)     Temp Source 01/01/17 1512 Oral     SpO2 01/01/17 1512 98 %     Weight 01/01/17 1512 145 lb (65.8 kg)     Height 01/01/17 1512 5' (1.524 m)     Head Circumference --      Peak Flow --      Pain Score 01/01/17 1513 4     Pain Loc --      Pain Edu? --      Excl. in [place]? --    Constitutional: Alert and oriented. Patient is talkative and engaged.  Eyes: Palpebral and bulbar conjunctiva are nonerythematous bilaterally. PERRL. EOMI.  Head:Patient has ecchymosis of the skin overlying the right cheek and tenderness to palpation over the inferior right orbit. ENT:      Ears:  Tympanic membranes are pearly bilaterally without bloody effusion visualized.       Nose: Nasal septum is midline without evidence of blood or septal hematoma.      Mouth/Throat: Mucous membranes are moist. Uvula is midline. Neck: Full range of motion. No pain with neck flexion. No pain with palpation of the cervical spine.  Cardiovascular: No pain with palpation over the anterior and posterior chest wall. Normal rate, regular rhythm. Normal S1 and S2. No murmurs, gallops or rubs auscultated.  Respiratory: No retractions or presence of deformity. Thoracic expansion is symmetric with unaccentuated tactile fremitus. Resonant and symmetric percussion tones bilaterally. On auscultation, adventitious sounds are absent.  Musculoskeletal: Patient has 5/5 strength in the upper and lower extremities bilaterally. Full range of motion at the shoulder, elbow and wrist bilaterally. Full range of  motion at the hip, knee and ankle bilaterally. Patient is able to perform flexion and extension at the right wrist. Patient is able to move all 5 right fingers. Patient has tenderness elicited with palpation over the base of the right fifth metacarpal. Patient also has tenderness elicited with palpation over the anterior right ribs, 9-10 th No changes in gait. Palpable radial, ulnar and dorsalis pedis pulses bilaterally and symmetrically. Neurologic: Normal speech and language. No gross focal neurologic deficits are appreciated. Cranial nerves: 2-10 normal as tested. Cerebellar: Finger-nose-finger WNL, heel to shin WNL. Sensorimotor: No sensory loss or abnormal reflexes. Vision: No visual field deficts noted to confrontation.  Speech: No dysarthria or expressive aphasia.  Skin: No lacerations or abrasions visualized. Psychiatric: Mood and affect are normal for age. Speech and behavior are normal.    ____________________________________________   LABS (all labs ordered are listed, but only abnormal results are  displayed)  Labs Reviewed - No data to display ____________________________________________  EKG   ____________________________________________  RADIOLOGY Unk Pinto, personally viewed and evaluated these images (plain radiographs) as part of my medical decision making, as well as reviewing the written report by the radiologist.    Dg Ribs Unilateral W/chest Right  Result Date: 01/01/2017 CLINICAL DATA:  Right wrist pain after fall off EXAM: RIGHT RIBS AND CHEST - 3+ VIEW COMPARISON:  None. FINDINGS: No fracture or other bone lesions are seen involving the ribs. There is no evidence of pneumothorax or pleural effusion. Both lungs are clear. Heart size and mediastinal contours are within normal limits. IMPRESSION: Negative. Electronically Signed   By: Kerby Moors M.D.   On: 01/01/2017 16:43   Dg Wrist Complete Right  Result Date: 01/01/2017 CLINICAL DATA:  Fall.  Pain. EXAM: RIGHT WRIST - COMPLETE 3+ VIEW COMPARISON:  No recent. FINDINGS: Diffuse osteopenia and degenerative change. No evidence of fracture or dislocation. IMPRESSION: Diffuse osteopenia degenerative change.  No acute abnormality. Electronically Signed   By: Marcello Moores  Register   On: 01/01/2017 16:38   Ct Head Wo Contrast  Result Date: 01/01/2017 CLINICAL DATA:  Recent fall, no evidence of loss of consciousness, but the patient did hit her head EXAM: CT HEAD WITHOUT CONTRAST TECHNIQUE: Contiguous axial images were obtained from the base of the skull through the vertex without intravenous contrast. COMPARISON:  None FINDINGS: Brain: The ventricular system is within normal limits in size for age and there is mild cortical atrophy present. The septum is midline in position. The fourth ventricle and basilar cisterns are unremarkable. No hemorrhage, mass lesion, or acute infarction is seen. Vascular: No vascular abnormality is seen on this unenhanced study. Skull: On bone window images, no calvarial abnormality is seen.  Sinuses/Orbits: The paranasal sinuses are clear. The orbital rims appear intact. The zygomatic arches are intact. Other: None. IMPRESSION: Negative unenhanced CT of the brain.  Mild cortical atrophy. Electronically Signed   By: Ivar Drape M.D.   On: 01/01/2017 16:53   Dg Hand Complete Right  Result Date: 01/01/2017 CLINICAL DATA:  Fall.  Pain . EXAM: RIGHT HAND - COMPLETE 3+ VIEW COMPARISON:  No prior. FINDINGS: A fracture of the base of the right fifth metacarpal cannot be excluded. Age is undetermined. No other focal abnormality identified. Diffuse degenerative change. IMPRESSION: Fracture of the base of the right fifth metacarpal, age undetermined, cannot be excluded. No other focal abnormality. Electronically Signed   By: Marcello Moores  Register   On: 01/01/2017 16:40   Ct Maxillofacial Wo Contrast  Result Date:  01/01/2017 CLINICAL DATA:  RIGHT facial swelling post fall EXAM: CT MAXILLOFACIAL WITHOUT CONTRAST TECHNIQUE: Multidetector CT imaging of the maxillofacial structures was performed. Multiplanar CT image reconstructions were also generated. A small metallic BB was placed on the right temple in order to reliably differentiate right from left. COMPARISON:  None FINDINGS: Osseous: Nasal septal deviation to the RIGHT. Calvaria intact. No acute facial bone fractures identified. Extensive periodontal disease with significant lucencies at the bases of teeth #s 6, 7, 9, 10, and 11 question periapical abscesses. Scattered beam hardening artifacts of dental origin. TMJ alignment normal. Orbits: Intraorbital soft tissue planes clear. No intraorbital pneumatosis or fluid collection. Sinuses: Small amount of fluid at the dependent portions of the LEFT mastoid air cells. Paranasal sinuses, mastoid air cells and middle ear cavities otherwise clear. Soft tissues: No significant hematoma or fluid collection seen. Limited intracranial: Unremarkable IMPRESSION: No acute facial bone abnormalities identified. Extensive  anterior maxillary periodontal disease as above question periapical abscesses. Electronically Signed   By: Lavonia Dana M.D.   On: 01/01/2017 16:25    ____________________________________________    PROCEDURES  Procedure(s) performed:    Procedures    Medications  naproxen (NAPROSYN) tablet 500 mg (500 mg Oral Given 01/01/17 5643)   SPLINT APPLICATION Date/Time: 3:29 PM Authorized by: Lannie Fields Consent: Verbal consent obtained. Risks and benefits: risks, benefits and alternatives were discussed Consent given by: patient Location details: Right Forearm  Post-procedure: The splinted body part was neurovascularly unchanged following the procedure. Patient tolerance: Patient tolerated the procedure well with no immediate complications. ____________________________________________   INITIAL IMPRESSION / ASSESSMENT AND PLAN / ED COURSE  Pertinent labs & imaging results that were available during my care of the patient were reviewed by me and considered in my medical decision making (see chart for details).  Review of the [place] CSRS was performed in accordance of the [place] prior to dispensing any controlled drugs.  Clinical Course as of Jan 01 1757  Tue Jan 01, 2017  1700 DG Hand Complete Right [JW]    Clinical Course User Index [JW] Lannie Fields, PA-C    Assessment and plan: Fall Right Base Metacarpal Fracture.  Patient presents to the emergency department with right facial pain,  right hand pain, right knee pain and right anterior rib pain. CT head and CT maxillofacial were noncontributory for acute fracture or intracranial hemorrhage. DG right ribs reveals no acute fractures. DG right hand reveals a likely nondisplaced fracture at the base of the right fifth metacarpal. A volar split was applied in the emergency department. Patient was neurovascularly intact after splint application. Patient was discharged with Tramadol for pain. A referral was made to orthopedics, Dr.  Mack Guise. Patient was advised to make an appointment tomorrow. Vital signs are reassuring at this time aside from hypertension. All patient questions were answered.  ____________________________________________  FINAL CLINICAL IMPRESSION(S) / ED DIAGNOSES  Final diagnoses:  Fall, initial encounter  Nondisplaced fracture of base of fifth metacarpal bone, right hand, initial encounter for closed fracture      NEW MEDICATIONS STARTED DURING THIS VISIT:  New Prescriptions   TRAMADOL (ULTRAM) 50 MG TABLET    Take 1 tablet (50 mg total) by mouth every 6 (six) hours as needed.        This chart was dictated using voice recognition software/Dragon. Despite best efforts to proofread, errors can occur which can change the meaning. Any change was purely unintentional.    Lannie Fields, PA-C 01/01/17 1801  Orbie Pyo, MD 01/01/17 [id number]

## 2017-01-01 NOTE — ED Notes (Signed)
See triage note   States she tripped  [place] face first  having pain to right wrist,right knee and under right rib area .she also is having some pain to face

## 2017-01-01 NOTE — ED Triage Notes (Signed)
Patient is an employee at [employer].  Searched WC database, nothing listed for [employer] for WC.

## 2017-01-01 NOTE — ED Triage Notes (Signed)
Patient presents to ED via POV with c/o right wrist pain after a fall. Patient states she tripped and fell. Denies LOC. Patient states she did hit her head.

## 2017-08-17 IMAGING — CT CT HEAD W/O CM
3 series · 15 of 46 positions shown, 18 images · non-contrast
Comparison: None

CLINICAL DATA: Recent fall, no evidence of loss of consciousness,
but the patient did hit her head

EXAM:
CT HEAD WITHOUT CONTRAST
TECHNIQUE: Contiguous axial images were obtained from the base of the skull
through the vertex without intravenous contrast.

[Series 2: head wo · axial · 0.40mm/px · z∈[-23,+97]mm · 9 of 29 slices shown, 12 images]
[im 3/29  brain]
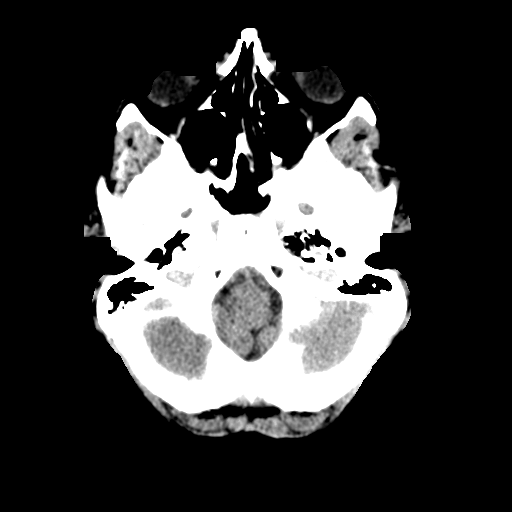
[im 3/29  bone]
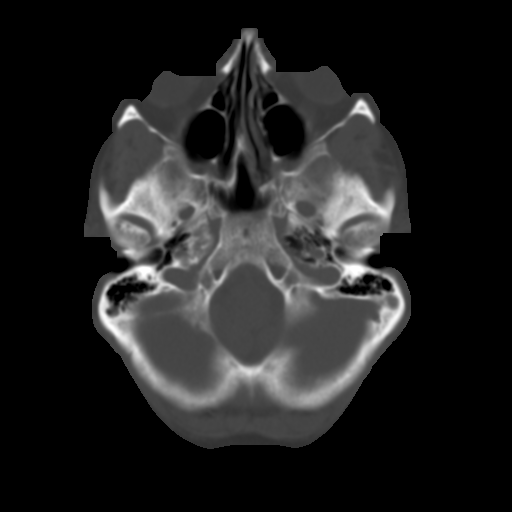
[im 6/29  brain]
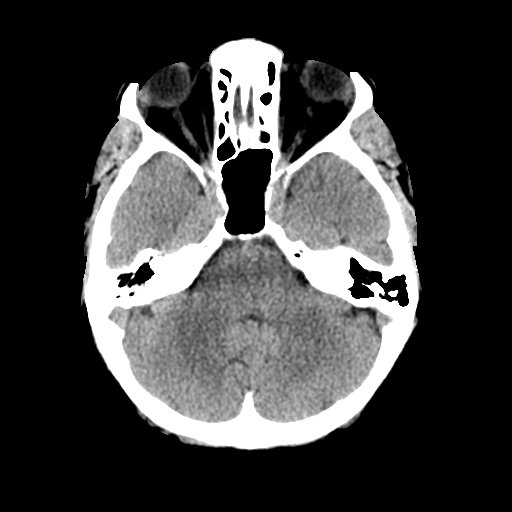
[im 9/29  brain]
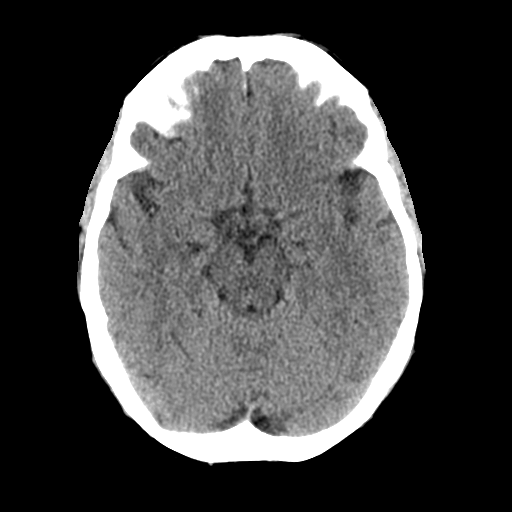
[im 12/29  brain]
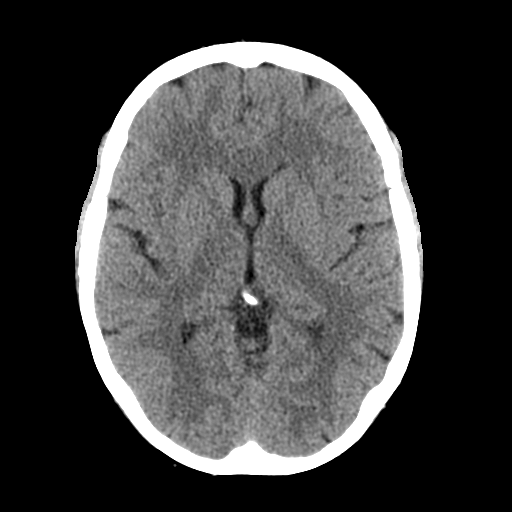
[im 15/29  brain]
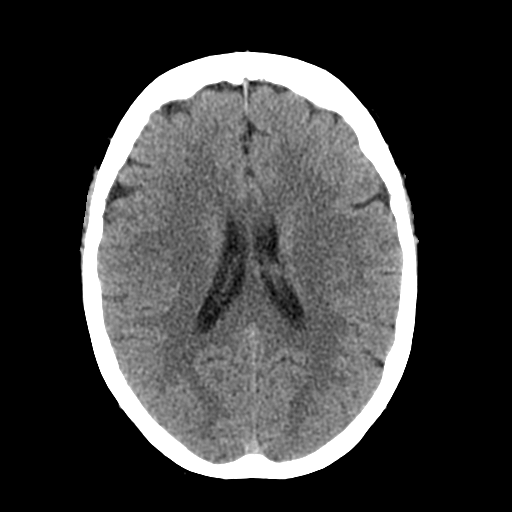
[im 15/29  bone]
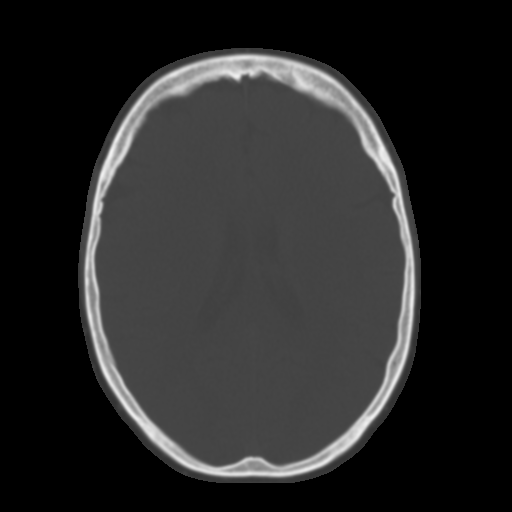
[im 18/29  brain]
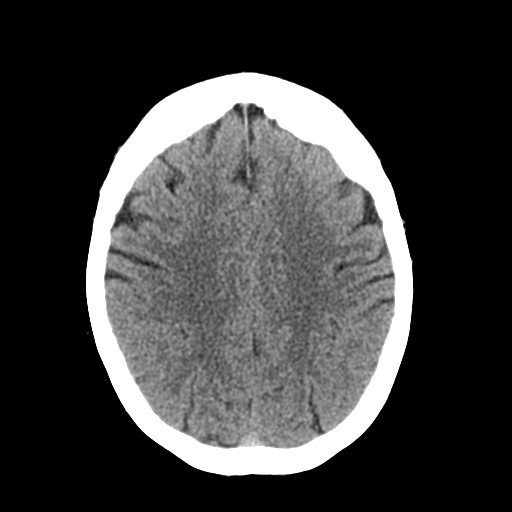
[im 21/29  brain]
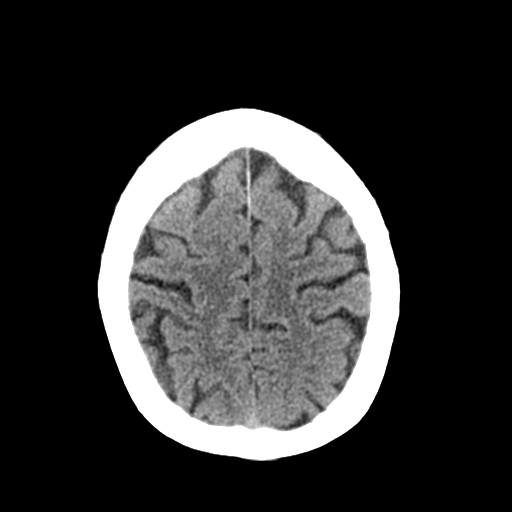
[im 24/29  brain]
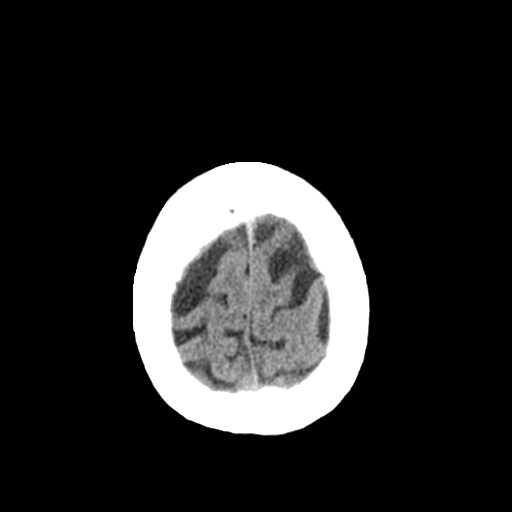
[im 27/29  brain]
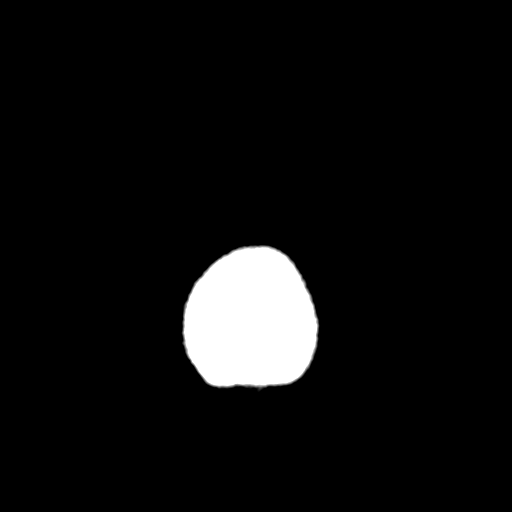
[im 27/29  bone]
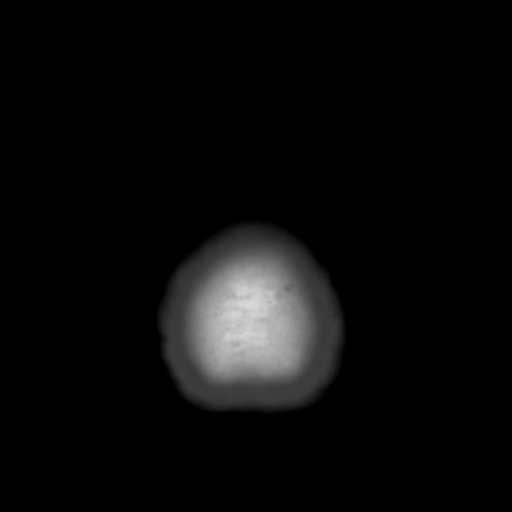

[Series 4: coronal soft tissue · coronal · 0.29mm/px · 3 of 60 slices shown]
[im 20/60  brain]
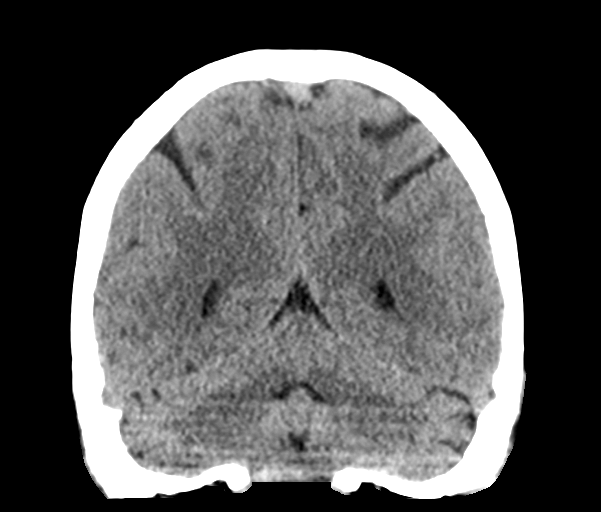
[im 27/60  brain]
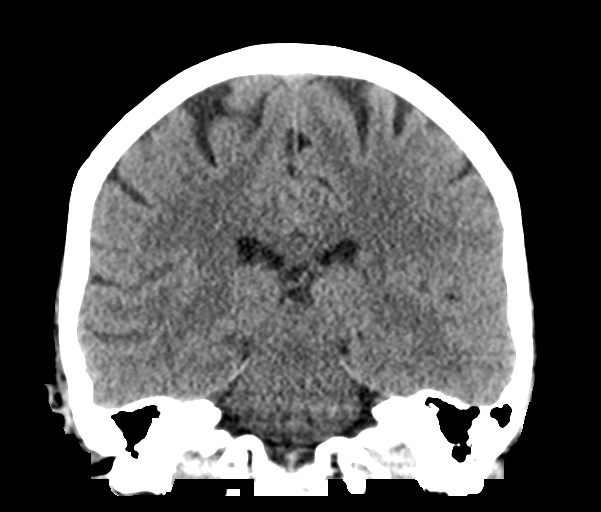
[im 33/60  brain]
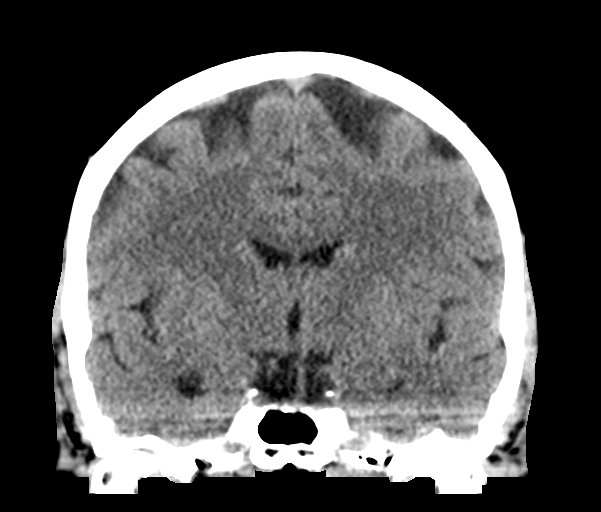

[Series 5: sagittal soft tissue · sagittal · 0.30mm/px · 3 of 47 slices shown]
[im 16/47  brain]
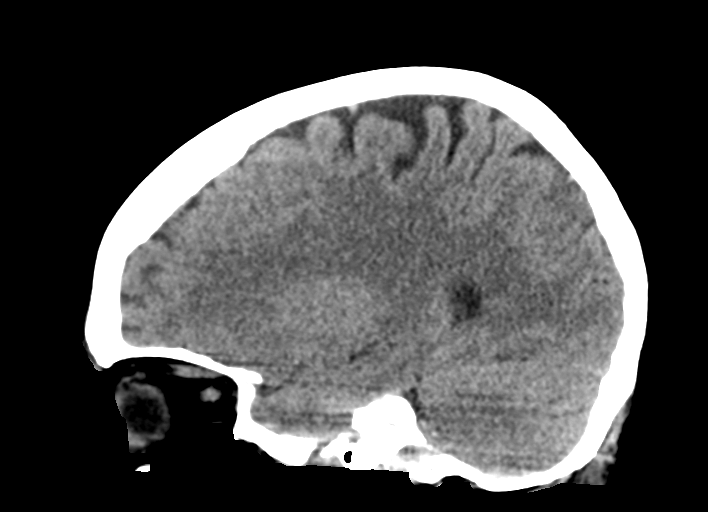
[im 24/47  brain]
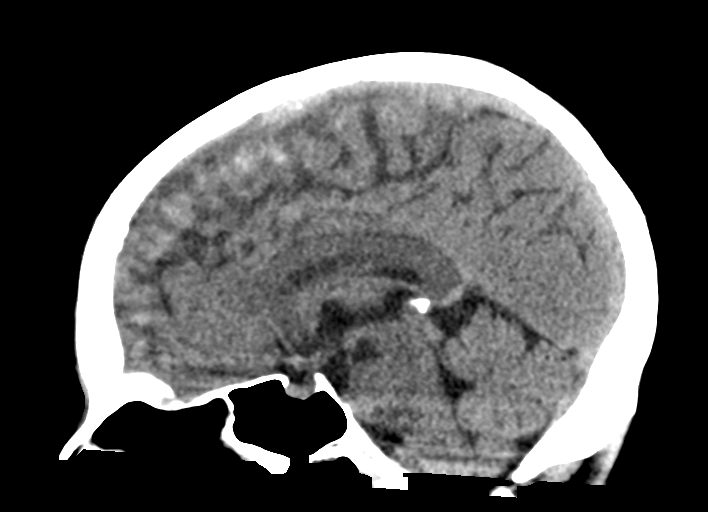
[im 31/47  brain]
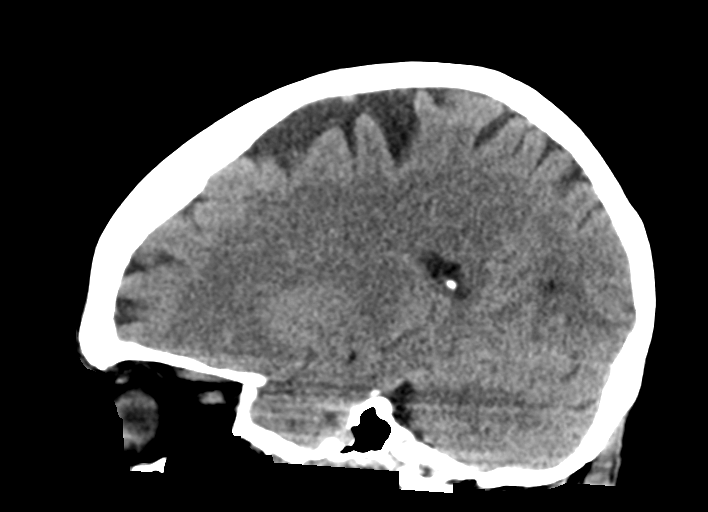

[15 of 46 positions shown; findings below may reference images not displayed]

FINDINGS: Brain: The ventricular system is within normal limits in size for
age and there is mild cortical atrophy present. The septum is
midline in position. The fourth ventricle and basilar cisterns are
unremarkable. No hemorrhage, mass lesion, or acute infarction is
seen.

Vascular: No vascular abnormality is seen on this unenhanced study.

Skull: On bone window images, no calvarial abnormality is seen.

Sinuses/Orbits: The paranasal sinuses are clear. The orbital rims
appear intact. The zygomatic arches are intact.

Other: None.
IMPRESSION: Negative unenhanced CT of the brain.  Mild cortical atrophy.

## 2021-12-25 ENCOUNTER — Ambulatory Visit: Payer: BLUE CROSS/BLUE SHIELD | Admitting: Dermatology

## 2022-02-26 ENCOUNTER — Ambulatory Visit: Payer: Self-pay | Admitting: Dermatology

## 2022-02-27 ENCOUNTER — Ambulatory Visit: Payer: Self-pay | Admitting: Dermatology

## 2022-07-25 ENCOUNTER — Ambulatory Visit: Payer: BC Managed Care – PPO | Admitting: Dermatology

## 2022-07-25 ENCOUNTER — Encounter: Payer: Self-pay | Admitting: Dermatology

## 2022-07-25 DIAGNOSIS — L720 Epidermal cyst: Secondary | ICD-10-CM

## 2022-07-25 NOTE — Progress Notes (Signed)
   New Patient Visit  Subjective  Savannah Abbott is a 76 y.o. female who presents for the following: Cyst (Left posterior neck. Raised. Dur: several years. Would like removed).  Bothersome.  Growing.  The patient has spots, moles and lesions to be evaluated, some may be new or changing and the patient has concerns that these could be cancer.   Review of Systems: No other skin or systemic complaints except as noted in HPI or Assessment and Plan.   Objective  Well appearing patient in no apparent distress; mood and affect are within normal limits.  A focused examination was performed including face, neck. Relevant physical exam findings are noted in the Assessment and Plan.  Left Posterior Neck Firm subcutaneous nodule. 1.2cm    Assessment & Plan  Epidermal inclusion cyst Left Posterior Neck  Benign-appearing. Exam most consistent with an epidermal inclusion cyst. Discussed that a cyst is a benign growth that can grow over time and sometimes get irritated or inflamed. Recommend observation if it is not bothersome. Discussed option of surgical excision to remove it if it is growing, symptomatic, or other changes noted. Please call for new or changing lesions so they can be evaluated.     Acrochordons (Skin Tags) - Fleshy, skin-colored pedunculated papules neck - Benign appearing.  - Observe. - If desired, they can be removed with an in office procedure that is not covered by insurance. - Please call the clinic if you notice any new or changing lesions.   Seborrheic Keratoses - Stuck-on, waxy, tan-brown papules and/or plaques  - Benign-appearing - Discussed benign etiology and prognosis. - Observe - Call for any changes  Hemangiomas - Red papules - Discussed benign nature - Observe - Call for any changes  Melanocytic Nevi. Face - Tan-brown and/or pink-flesh-colored symmetric macules and papules - Benign appearing on exam today - Observation - Call clinic for new or  changing moles - Recommend daily use of broad spectrum spf 30+ sunscreen to sun-exposed areas.    Return for Cyst Excision, Next Available.  I, Emelia Salisbury, CMA, am acting as scribe for Brendolyn Patty, MD.  Documentation: I have reviewed the above documentation for accuracy and completeness, and I agree with the above.  Brendolyn Patty MD

## 2022-07-25 NOTE — Patient Instructions (Addendum)
 Pre-Operative Instructions  You are scheduled for a surgical procedure at [facility]. We recommend you read the following instructions. If you have any questions or concerns, please call the office at [phone].  Shower and wash the entire body with soap and water the day of your surgery paying special attention to cleansing at and around the planned surgery site.  Avoid aspirin or aspirin containing products at least fourteen (14) days prior to your surgical procedure and for at least one week (7 Days) after your surgical procedure. If you take aspirin on a regular basis for heart disease or history of stroke or for any other reason, we may recommend you continue taking aspirin but please notify us if you take this on a regular basis. Aspirin can cause more bleeding to occur during surgery as well as prolonged bleeding and bruising after surgery.   Avoid other nonsteroidal pain medications at least one week prior to surgery and at least one week prior to your surgery. These include medications such as Ibuprofen (Motrin, Advil and Nuprin), Naprosyn, Voltaren, Relafen, etc. If medications are used for therapeutic reasons, please inform us as they can cause increased bleeding or prolonged bleeding during and bruising after surgical procedures.   Please advise us if you are taking any "blood thinner" medications such as Coumadin or Dipyridamole or Plavix or similar medications. These cause increased bleeding and prolonged bleeding during procedures and bruising after surgical procedures. We may have to consider discontinuing these medications briefly prior to and shortly after your surgery if safe to do so.   Please inform us of all medications you are currently taking. All medications that are taken regularly should be taken the day of surgery as you always do. Nevertheless, we need to be informed of what medications you are taking prior to surgery to know whether they will affect the  procedure or cause any complications.   Please inform us of any medication allergies. Also inform us of whether you have allergies to Latex or rubber products or whether you have had any adverse reaction to Lidocaine or Epinephrine.  Please inform us of any prosthetic or artificial body parts such as artificial heart valve, joint replacements, etc., or similar condition that might require preoperative antibiotics.   We recommend avoidance of alcohol at least two weeks prior to surgery and continued avoidance for at least two weeks after surgery.   We recommend discontinuation of tobacco smoking at least two weeks prior to surgery and continued abstinence for at least two weeks after surgery.  Do not plan strenuous exercise, strenuous work or strenuous lifting for approximately four weeks after your surgery.   We request if you are unable to make your scheduled surgical appointment, please call us at least a week in advance or as soon as you are aware of a problem so that we can cancel or reschedule the appointment.   You MAY TAKE TYLENOL (acetaminophen) for pain as it is not a blood thinner.   PLEASE PLAN TO BE IN TOWN FOR TWO WEEKS FOLLOWING SURGERY, THIS IS IMPORTANT SO YOU CAN BE CHECKED FOR DRESSING CHANGES, SUTURE REMOVAL AND TO MONITOR FOR POSSIBLE COMPLICATIONS.    Due to recent changes in healthcare laws, you may see results of your pathology and/or laboratory studies on MyChart before the doctors have had a chance to review them. We understand that in some cases there may be results that are confusing or concerning to you. Please understand that not all results are   received at the same time and often the doctors may need to interpret multiple results in order to provide you with the best plan of care or course of treatment. Therefore, we ask that you please give us 2 business days to thoroughly review all your results before contacting the office for clarification. Should we see a  critical lab result, you will be contacted sooner.   If You Need Anything After Your Visit  If you have any questions or concerns for your doctor, please call our main line at [phone] and press option 4 to reach your doctor's medical assistant. If no one answers, please leave a voicemail as directed and we will return your call as soon as possible. Messages left after 4 pm will be answered the following business day.   You may also send us a message via MyChart. We typically respond to MyChart messages within 1-2 business days.  For prescription refills, please ask your pharmacy to contact our office. Our fax number is [phone].  If you have an urgent issue when the clinic is closed that cannot wait until the next business day, you can page your doctor at the number below.    Please note that while we do our best to be available for urgent issues outside of office hours, we are not available 24/7.   If you have an urgent issue and are unable to reach us, you may choose to seek medical care at your doctor's office, retail clinic, urgent care center, or emergency room.  If you have a medical emergency, please immediately call 911 or go to the emergency department.  Pager Numbers  - Dr. Kowalski: [phone]  - Dr. Moye: [phone]  - Dr. Stewart: [phone]  In the event of inclement weather, please call our main line at [phone] for an update on the status of any delays or closures.  Dermatology Medication Tips: Please keep the boxes that topical medications come in in order to help keep track of the instructions about where and how to use these. Pharmacies typically print the medication instructions only on the boxes and not directly on the medication tubes.   If your medication is too expensive, please contact our office at [phone] option 4 or send us a message through MyChart.   We are unable to tell what your co-pay for medications will be in advance as  this is different depending on your insurance coverage. However, we may be able to find a substitute medication at lower cost or fill out paperwork to get insurance to cover a needed medication.   If a prior authorization is required to get your medication covered by your insurance company, please allow us 1-2 business days to complete this process.  Drug prices often vary depending on where the prescription is filled and some pharmacies may offer cheaper prices.  The website www.goodrx.com contains coupons for medications through different pharmacies. The prices here do not account for what the cost may be with help from insurance (it may be cheaper with your insurance), but the website can give you the price if you did not use any insurance.  - You can print the associated coupon and take it with your prescription to the pharmacy.  - You may also stop by our office during regular business hours and pick up a GoodRx coupon card.  - If you need your prescription sent electronically to a different pharmacy, notify our office through [facility] MyChart or by phone at [phone] option 4.       Si Usted Necesita Algo Despus de Su Visita  Tambin puede enviarnos un mensaje a travs de MyChart. Por lo general respondemos a los mensajes de MyChart en el transcurso de 1 a 2 das hbiles.  Para renovar recetas, por favor pida a su farmacia que se ponga en contacto con nuestra oficina. Nuestro nmero de fax es el [phone].  Si tiene un asunto urgente cuando la clnica est cerrada y que no puede esperar hasta el siguiente da hbil, puede llamar/localizar a su doctor(a) al nmero que aparece a continuacin.   Por favor, tenga en cuenta que aunque hacemos todo lo posible para estar disponibles para asuntos urgentes fuera del horario de oficina, no estamos disponibles las 24 horas del da, los 7 das de la semana.   Si tiene un problema urgente y no puede comunicarse con nosotros, puede optar por  buscar atencin mdica  en el consultorio de su doctor(a), en una clnica privada, en un centro de atencin urgente o en una sala de emergencias.  Si tiene una emergencia mdica, por favor llame inmediatamente al 911 o vaya a la sala de emergencias.  Nmeros de bper  - Dr. Kowalski: [phone]  - Dra. Moye: [phone]  - Dra. Stewart: [phone]  En caso de inclemencias del tiempo, por favor llame a nuestra lnea principal al [phone] para una actualizacin sobre el estado de cualquier retraso o cierre.  Consejos para la medicacin en dermatologa: Por favor, guarde las cajas en las que vienen los medicamentos de uso tpico para ayudarle a seguir las instrucciones sobre dnde y cmo usarlos. Las farmacias generalmente imprimen las instrucciones del medicamento slo en las cajas y no directamente en los tubos del medicamento.   Si su medicamento es muy caro, por favor, pngase en contacto con nuestra oficina llamando al [phone] y presione la opcin 4 o envenos un mensaje a travs de MyChart.   No podemos decirle cul ser su copago por los medicamentos por adelantado ya que esto es diferente dependiendo de la cobertura de su seguro. Sin embargo, es posible que podamos encontrar un medicamento sustituto a menor costo o llenar un formulario para que el seguro cubra el medicamento que se considera necesario.   Si se requiere una autorizacin previa para que su compaa de seguros cubra su medicamento, por favor permtanos de 1 a 2 das hbiles para completar este proceso.  Los precios de los medicamentos varan con frecuencia dependiendo del lugar de dnde se surte la receta y alguna farmacias pueden ofrecer precios ms baratos.  El sitio web www.goodrx.com tiene cupones para medicamentos de diferentes farmacias. Los precios aqu no tienen en cuenta lo que podra costar con la ayuda del seguro (puede ser ms barato con su seguro), pero el sitio web puede darle el precio si no  utiliz ningn seguro.  - Puede imprimir el cupn correspondiente y llevarlo con su receta a la farmacia.  - Tambin puede pasar por nuestra oficina durante el horario de atencin regular y recoger una tarjeta de cupones de GoodRx.  - Si necesita que su receta se enve electrnicamente a una farmacia diferente, informe a nuestra oficina a travs de MyChart de [facility] o por telfono llamando al [phone] y presione la opcin 4.  

## 2022-08-13 ENCOUNTER — Ambulatory Visit (INDEPENDENT_AMBULATORY_CARE_PROVIDER_SITE_OTHER): Payer: BC Managed Care – PPO | Admitting: Dermatology

## 2022-08-13 DIAGNOSIS — D485 Neoplasm of uncertain behavior of skin: Secondary | ICD-10-CM

## 2022-08-13 DIAGNOSIS — L720 Epidermal cyst: Secondary | ICD-10-CM

## 2022-08-13 NOTE — Progress Notes (Signed)
   Follow-Up Visit   Subjective  Savannah Abbott is a 76 y.o. female who presents for the following: Procedure (Patient here today for excision of cyst at left posterior neck. ).   The following portions of the chart were reviewed this encounter and updated as appropriate:       Review of Systems:  No other skin or systemic complaints except as noted in HPI or Assessment and Plan.  Objective  Well appearing patient in no apparent distress; mood and affect are within normal limits.  A focused examination was performed including neck. Relevant physical exam findings are noted in the Assessment and Plan.  left posterior neck Firm subcutaneous nodule. 1.4cm     Assessment & Plan  Neoplasm of uncertain behavior of skin left posterior neck  Skin excision  Lesion length (cm):  1.4 Lesion width (cm):  1.4 Margin per side (cm):  0.1 Total excision diameter (cm):  1.6 Informed consent: discussed and consent obtained   Timeout: patient name, date of birth, surgical site, and procedure verified   Procedure prep:  Patient was prepped and draped in usual sterile fashion Prep type:  Povidone-iodine Anesthesia: the lesion was anesthetized in a standard fashion   Anesthetic:  1% lidocaine w/ epinephrine 1-100,000 buffered w/ 8.4% NaHCO3 (6cc lido w/epi, 3cc bupivicaine) Instrument used comment:  15c Hemostasis achieved with: pressure   Outcome: patient tolerated procedure well with no complications    Skin repair Complexity:  Intermediate Final length (cm):  1.8 Informed consent: discussed and consent obtained   Timeout: patient name, date of birth, surgical site, and procedure verified   Reason for type of repair: reduce tension to allow closure, reduce the risk of dehiscence, infection, and necrosis, reduce subcutaneous dead space and avoid a hematoma, preserve normal anatomical and functional relationships and enhance both functionality and cosmetic results   Undermining: edges  undermined   Subcutaneous layers (deep stitches):  Suture size:  4-0 Suture type: Vicryl (polyglactin 910)   Subcutaneous suture technique: inverted dermal. Fine/surface layer approximation (top stitches):  Suture size:  4-0 Suture type: nylon   Stitches: simple interrupted   Suture removal (days):  7 Hemostasis achieved with: suture Outcome: patient tolerated procedure well with no complications   Post-procedure details: sterile dressing applied and wound care instructions given   Dressing type: pressure dressing (Mupirocin ointment)    Specimen 1 - Surgical pathology Differential Diagnosis: r/o cyst vs other   Check Margins: No Firm subcutaneous nodule. 1.4cm   Return in about 1 week (around 08/20/2022) for Suture Removal.  Graciella Belton, RMA, am acting as scribe for Brendolyn Patty, MD .  Documentation: I have reviewed the above documentation for accuracy and completeness, and I agree with the above.  Brendolyn Patty MD

## 2022-08-13 NOTE — Patient Instructions (Signed)
Wound Care Instructions for After Surgery  On the day following your surgery, you should begin doing daily dressing changes until your sutures are removed: Remove the bandage. Cleanse the wound gently with soap and water.  Make sure you then dry the skin surrounding the wound completely or the tape will not stick to the skin. Do not use cotton balls on the wound. After the wound is clean and dry, apply the ointment (either prescription antibiotic prescribed by your doctor or plain Vaseline if nothing was prescribed) gently with a Q-tip. If you are using a bandaid to cover: Apply a bandaid large enough to cover the entire wound. If you do not have a bandaid large enough to cover the wound OR if you are sensitive to bandaid adhesive: Cut a non-stick pad (such as Telfa) to fit the size of the wound.  Cover the wound with the non-stick pad. If the wound is draining, you may want to add a small amount of gauze on top of the non-stick pad for a little added compression to the area. Use tape to seal the area completely.  For the next 1-2 weeks: Be sure to keep the wound moist with ointment 24/7 to ensure best healing. If you are unable to cover the wound with a bandage to hold the ointment in place, you may need to reapply the ointment several times a day. Do not bend over or lift heavy items to reduce the chance of elevated blood pressure to the wound. Do not participate in particularly strenuous activities.  Below is a list of dressing supplies you might need.  Cotton-tipped applicators - Q-tips Gauze pads (2x2 and/or 4x4) - All-Purpose Sponges New and clean tube of petroleum jelly (Vaseline) OR prescription antibiotic ointment if prescribed Either a bandaid large enough to cover the entire wound OR non-stick dressing material (Telfa) and Tape (Paper or Hypafix)  FOR ADULT SURGERY PATIENTS: If you need something for pain relief, you may take 1 extra strength Tylenol (acetaminophen) and 2  ibuprofen (200 mg) together every 4 hours as needed. (Do not take these medications if you are allergic to them or if you know you cannot take them for any other reason). Typically you may only need pain medication for 1-3 days.   Comments on the Post-Operative Period Slight swelling and redness often appear around the wound. This is normal and will disappear within several days following the surgery. The healing wound will drain a brownish-red-yellow discharge during healing. This is a normal phase of wound healing. As the wound begins to heal, the drainage may increase in amount. Again, this drainage is normal. Notify us if the drainage becomes persistently bloody, excessively swollen, or intensely painful or develops a foul odor or red streaks.  The healing wound will also typically be itchy. This is normal. If you have severe or persistent pain, Notify us if the discomfort is severe or persistent. Avoid alcoholic beverages when taking pain medicine.  In Case of Wound Hemorrhage A wound hemorrhage is when the bandage suddenly becomes soaked with bright red blood and flows profusely. If this happens, sit down or lie down with your head elevated. If the wound has a dressing on it, do not remove the dressing. Apply pressure to the existing gauze. If the wound is not covered, use a gauze pad to apply pressure and continue applying the pressure for 20 minutes without peeking. DO NOT COVER THE WOUND WITH A LARGE TOWEL OR WASH CLOTH. Release your hand from the   wound site but do not remove the dressing. If the bleeding has stopped, gently clean around the wound. Leave the dressing in place for 24 hours if possible. This wait time allows the blood vessels to close off so that you do not spark a new round of bleeding by disrupting the newly clotted blood vessels with an immediate dressing change. If the bleeding does not subside, continue to hold pressure for 40 minutes. If bleeding continues, page your  physician, contact an After Hours clinic or go to the Emergency Room.  Due to recent changes in healthcare laws, you may see results of your pathology and/or laboratory studies on MyChart before the doctors have had a chance to review them. We understand that in some cases there may be results that are confusing or concerning to you. Please understand that not all results are received at the same time and often the doctors may need to interpret multiple results in order to provide you with the best plan of care or course of treatment. Therefore, we ask that you please give us 2 business days to thoroughly review all your results before contacting the office for clarification. Should we see a critical lab result, you will be contacted sooner.   If You Need Anything After Your Visit  If you have any questions or concerns for your doctor, please call our main line at [phone] and press option 4 to reach your doctor's medical assistant. If no one answers, please leave a voicemail as directed and we will return your call as soon as possible. Messages left after 4 pm will be answered the following business day.   You may also send us a message via MyChart. We typically respond to MyChart messages within 1-2 business days.  For prescription refills, please ask your pharmacy to contact our office. Our fax number is [phone].  If you have an urgent issue when the clinic is closed that cannot wait until the next business day, you can page your doctor at the number below.    Please note that while we do our best to be available for urgent issues outside of office hours, we are not available 24/7.   If you have an urgent issue and are unable to reach us, you may choose to seek medical care at your doctor's office, retail clinic, urgent care center, or emergency room.  If you have a medical emergency, please immediately call 911 or go to the emergency department.  Pager Numbers  - Dr. Kowalski:  [phone]  - Dr. Moye: [phone]  - Dr. Stewart: [phone]  In the event of inclement weather, please call our main line at [phone] for an update on the status of any delays or closures.  Dermatology Medication Tips: Please keep the boxes that topical medications come in in order to help keep track of the instructions about where and how to use these. Pharmacies typically print the medication instructions only on the boxes and not directly on the medication tubes.   If your medication is too expensive, please contact our office at [phone] option 4 or send us a message through MyChart.   We are unable to tell what your co-pay for medications will be in advance as this is different depending on your insurance coverage. However, we may be able to find a substitute medication at lower cost or fill out paperwork to get insurance to cover a needed medication.   If a prior authorization is required to get your medication covered by   your insurance company, please allow us 1-2 business days to complete this process.  Drug prices often vary depending on where the prescription is filled and some pharmacies may offer cheaper prices.  The website www.goodrx.com contains coupons for medications through different pharmacies. The prices here do not account for what the cost may be with help from insurance (it may be cheaper with your insurance), but the website can give you the price if you did not use any insurance.  - You can print the associated coupon and take it with your prescription to the pharmacy.  - You may also stop by our office during regular business hours and pick up a GoodRx coupon card.  - If you need your prescription sent electronically to a different pharmacy, notify our office through [facility] MyChart or by phone at [phone] option 4.     Si Usted Necesita Algo Despus de Su Visita  Tambin puede enviarnos un mensaje a travs de MyChart. Por lo general  respondemos a los mensajes de MyChart en el transcurso de 1 a 2 das hbiles.  Para renovar recetas, por favor pida a su farmacia que se ponga en contacto con nuestra oficina. Nuestro nmero de fax es el [phone].  Si tiene un asunto urgente cuando la clnica est cerrada y que no puede esperar hasta el siguiente da hbil, puede llamar/localizar a su doctor(a) al nmero que aparece a continuacin.   Por favor, tenga en cuenta que aunque hacemos todo lo posible para estar disponibles para asuntos urgentes fuera del horario de oficina, no estamos disponibles las 24 horas del da, los 7 das de la semana.   Si tiene un problema urgente y no puede comunicarse con nosotros, puede optar por buscar atencin mdica  en el consultorio de su doctor(a), en una clnica privada, en un centro de atencin urgente o en una sala de emergencias.  Si tiene una emergencia mdica, por favor llame inmediatamente al 911 o vaya a la sala de emergencias.  Nmeros de bper  - Dr. Kowalski: [phone]  - Dra. Moye: [phone]  - Dra. Stewart: [phone]  En caso de inclemencias del tiempo, por favor llame a nuestra lnea principal al [phone] para una actualizacin sobre el estado de cualquier retraso o cierre.  Consejos para la medicacin en dermatologa: Por favor, guarde las cajas en las que vienen los medicamentos de uso tpico para ayudarle a seguir las instrucciones sobre dnde y cmo usarlos. Las farmacias generalmente imprimen las instrucciones del medicamento slo en las cajas y no directamente en los tubos del medicamento.   Si su medicamento es muy caro, por favor, pngase en contacto con nuestra oficina llamando al [phone] y presione la opcin 4 o envenos un mensaje a travs de MyChart.   No podemos decirle cul ser su copago por los medicamentos por adelantado ya que esto es diferente dependiendo de la cobertura de su seguro. Sin embargo, es posible que podamos encontrar un  medicamento sustituto a menor costo o llenar un formulario para que el seguro cubra el medicamento que se considera necesario.   Si se requiere una autorizacin previa para que su compaa de seguros cubra su medicamento, por favor permtanos de 1 a 2 das hbiles para completar este proceso.  Los precios de los medicamentos varan con frecuencia dependiendo del lugar de dnde se surte la receta y alguna farmacias pueden ofrecer precios ms baratos.  El sitio web www.goodrx.com tiene cupones para medicamentos de diferentes farmacias. Los precios aqu no tienen   en cuenta lo que podra costar con la ayuda del seguro (puede ser ms barato con su seguro), pero el sitio web puede darle el precio si no utiliz ningn seguro.  - Puede imprimir el cupn correspondiente y llevarlo con su receta a la farmacia.  - Tambin puede pasar por nuestra oficina durante el horario de atencin regular y recoger una tarjeta de cupones de GoodRx.  - Si necesita que su receta se enve electrnicamente a una farmacia diferente, informe a nuestra oficina a travs de MyChart [facility] o por telfono llamando al [phone] y presione la opcin 4.  

## 2022-08-20 ENCOUNTER — Ambulatory Visit (INDEPENDENT_AMBULATORY_CARE_PROVIDER_SITE_OTHER): Payer: BC Managed Care – PPO | Admitting: Dermatology

## 2022-08-20 DIAGNOSIS — Z4802 Encounter for removal of sutures: Secondary | ICD-10-CM

## 2022-08-20 DIAGNOSIS — L720 Epidermal cyst: Secondary | ICD-10-CM

## 2022-08-20 NOTE — Progress Notes (Signed)
   Follow-Up Visit   Subjective  Savannah Abbott is a 76 y.o. female who presents for the following: Post op (Epidermal inclusion cyst of the left posterior neck. Patient presents for suture removal. ).   The following portions of the chart were reviewed this encounter and updated as appropriate:       Review of Systems:  No other skin or systemic complaints except as noted in HPI or Assessment and Plan.  Objective  Well appearing patient in no apparent distress; mood and affect are within normal limits.  A focused examination was performed including left posterior neck. Relevant physical exam findings are noted in the Assessment and Plan.  Left Posterior Neck Excision site healing well, no evidence of infection     Assessment & Plan  Epidermal inclusion cyst Left Posterior Neck  Biopsy proven benign cyst  Wound cleansed, sutures removed, wound cleansed and steri strips applied. Discussed pathology results.     Return if symptoms worsen or fail to improve.  IJamesetta Orleans, CMA, am acting as scribe for Brendolyn Patty, MD .  Documentation: I have reviewed the above documentation for accuracy and completeness, and I agree with the above.  Brendolyn Patty MD

## 2022-08-20 NOTE — Patient Instructions (Addendum)
After Suture Removal  If your medical team has placed Steri-Strips (white adhesive strips covering the surgical site to provide extra support): Keep the area dry until they fall off.  Do not peel them off. Just let them fall off on their own.  If the edges peel up, you can trim them with scissors.   If your team has not placed Steri-Strips: Wash the area daily with soap and water. Then coat the incision site with plain Vaseline and cover with a bandage. Do this daily for 5 days after the sutures are removed. After that, no additional wound care is generally needed.  However, if you would like to help fade the scar, you can apply a silicone scar cream, gel or sheet every night. The scar will remodel for one year after the procedure. If a skin cancer was removed, be sure to keep your appointment with your dermatologist for follow-up and let your dermatology team know if you have any new or changing spots between visits.    Please call our office at [phone] for any questions or concerns.       Due to recent changes in healthcare laws, you may see results of your pathology and/or laboratory studies on MyChart before the doctors have had a chance to review them. We understand that in some cases there may be results that are confusing or concerning to you. Please understand that not all results are received at the same time and often the doctors may need to interpret multiple results in order to provide you with the best plan of care or course of treatment. Therefore, we ask that you please give us 2 business days to thoroughly review all your results before contacting the office for clarification. Should we see a critical lab result, you will be contacted sooner.   If You Need Anything After Your Visit  If you have any questions or concerns for your doctor, please call our main line at [phone] and press option 4 to reach your doctor's medical assistant. If no one answers, please leave  a voicemail as directed and we will return your call as soon as possible. Messages left after 4 pm will be answered the following business day.   You may also send us a message via MyChart. We typically respond to MyChart messages within 1-2 business days.  For prescription refills, please ask your pharmacy to contact our office. Our fax number is [phone].  If you have an urgent issue when the clinic is closed that cannot wait until the next business day, you can page your doctor at the number below.    Please note that while we do our best to be available for urgent issues outside of office hours, we are not available 24/7.   If you have an urgent issue and are unable to reach us, you may choose to seek medical care at your doctor's office, retail clinic, urgent care center, or emergency room.  If you have a medical emergency, please immediately call 911 or go to the emergency department.  Pager Numbers  - Dr. Kowalski: [phone]  - Dr. Moye: [phone]  - Dr. Stewart: [phone]  In the event of inclement weather, please call our main line at [phone] for an update on the status of any delays or closures.  Dermatology Medication Tips: Please keep the boxes that topical medications come in in order to help keep track of the instructions about where and how to use these. Pharmacies typically print the medication   instructions only on the boxes and not directly on the medication tubes.   If your medication is too expensive, please contact our office at [phone] option 4 or send us a message through MyChart.   We are unable to tell what your co-pay for medications will be in advance as this is different depending on your insurance coverage. However, we may be able to find a substitute medication at lower cost or fill out paperwork to get insurance to cover a needed medication.   If a prior authorization is required to get your medication covered by your insurance  company, please allow us 1-2 business days to complete this process.  Drug prices often vary depending on where the prescription is filled and some pharmacies may offer cheaper prices.  The website www.goodrx.com contains coupons for medications through different pharmacies. The prices here do not account for what the cost may be with help from insurance (it may be cheaper with your insurance), but the website can give you the price if you did not use any insurance.  - You can print the associated coupon and take it with your prescription to the pharmacy.  - You may also stop by our office during regular business hours and pick up a GoodRx coupon card.  - If you need your prescription sent electronically to a different pharmacy, notify our office through [facility] MyChart or by phone at [phone] option 4.     Si Usted Necesita Algo Despus de Su Visita  Tambin puede enviarnos un mensaje a travs de MyChart. Por lo general respondemos a los mensajes de MyChart en el transcurso de 1 a 2 das hbiles.  Para renovar recetas, por favor pida a su farmacia que se ponga en contacto con nuestra oficina. Nuestro nmero de fax es el [phone].  Si tiene un asunto urgente cuando la clnica est cerrada y que no puede esperar hasta el siguiente da hbil, puede llamar/localizar a su doctor(a) al nmero que aparece a continuacin.   Por favor, tenga en cuenta que aunque hacemos todo lo posible para estar disponibles para asuntos urgentes fuera del horario de oficina, no estamos disponibles las 24 horas del da, los 7 das de la semana.   Si tiene un problema urgente y no puede comunicarse con nosotros, puede optar por buscar atencin mdica  en el consultorio de su doctor(a), en una clnica privada, en un centro de atencin urgente o en una sala de emergencias.  Si tiene una emergencia mdica, por favor llame inmediatamente al 911 o vaya a la sala de emergencias.  Nmeros de bper  - Dr.  Kowalski: [phone]  - Dra. Moye: [phone]  - Dra. Stewart: [phone]  En caso de inclemencias del tiempo, por favor llame a nuestra lnea principal al [phone] para una actualizacin sobre el estado de cualquier retraso o cierre.  Consejos para la medicacin en dermatologa: Por favor, guarde las cajas en las que vienen los medicamentos de uso tpico para ayudarle a seguir las instrucciones sobre dnde y cmo usarlos. Las farmacias generalmente imprimen las instrucciones del medicamento slo en las cajas y no directamente en los tubos del medicamento.   Si su medicamento es muy caro, por favor, pngase en contacto con nuestra oficina llamando al [phone] y presione la opcin 4 o envenos un mensaje a travs de MyChart.   No podemos decirle cul ser su copago por los medicamentos por adelantado ya que esto es diferente dependiendo de la cobertura de su seguro. Sin embargo,   es posible que podamos encontrar un medicamento sustituto a menor costo o llenar un formulario para que el seguro cubra el medicamento que se considera necesario.   Si se requiere una autorizacin previa para que su compaa de seguros cubra su medicamento, por favor permtanos de 1 a 2 das hbiles para completar este proceso.  Los precios de los medicamentos varan con frecuencia dependiendo del lugar de dnde se surte la receta y alguna farmacias pueden ofrecer precios ms baratos.  El sitio web www.goodrx.com tiene cupones para medicamentos de diferentes farmacias. Los precios aqu no tienen en cuenta lo que podra costar con la ayuda del seguro (puede ser ms barato con su seguro), pero el sitio web puede darle el precio si no utiliz ningn seguro.  - Puede imprimir el cupn correspondiente y llevarlo con su receta a la farmacia.  - Tambin puede pasar por nuestra oficina durante el horario de atencin regular y recoger una tarjeta de cupones de GoodRx.  - Si necesita que su receta se enve  electrnicamente a una farmacia diferente, informe a nuestra oficina a travs de MyChart de [facility] o por telfono llamando al [phone] y presione la opcin 4.
